# Patient Record
Sex: Male | Born: 1985 | Race: Black or African American | Hispanic: No | Marital: Single | Smoking: Current every day smoker
Health system: Southern US, Community
[De-identification: ages and names within clinical notes are randomized; demographics above are authoritative.]

## PROBLEM LIST (undated history)

## (undated) ENCOUNTER — Ambulatory Visit

## (undated) HISTORY — PX: MOUTH SURGERY: SHX715

---
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
## 2006-02-22 ENCOUNTER — Emergency Department (HOSPITAL_COMMUNITY): Admission: EM | Admit: 2006-02-22 | Discharge: 2006-02-22 | Payer: Self-pay | Admitting: Emergency Medicine

## 2009-04-30 ENCOUNTER — Emergency Department (HOSPITAL_COMMUNITY): Admission: EM | Admit: 2009-04-30 | Discharge: 2009-04-30 | Payer: Self-pay | Admitting: Emergency Medicine

## 2010-03-05 ENCOUNTER — Emergency Department (HOSPITAL_COMMUNITY): Admission: EM | Admit: 2010-03-05 | Discharge: 2009-08-16 | Payer: Self-pay | Admitting: Emergency Medicine

## 2010-12-08 ENCOUNTER — Emergency Department (HOSPITAL_COMMUNITY)
Admission: EM | Admit: 2010-12-08 | Discharge: 2010-12-09 | Disposition: A | Discharge status: Home / Self Care | Payer: Self-pay | Attending: Emergency Medicine | Admitting: Emergency Medicine

## 2010-12-08 ENCOUNTER — Emergency Department (HOSPITAL_COMMUNITY): Payer: Self-pay

## 2010-12-08 DIAGNOSIS — M546 Pain in thoracic spine: Secondary | ICD-10-CM | POA: Insufficient documentation

## 2010-12-08 DIAGNOSIS — R509 Fever, unspecified: Secondary | ICD-10-CM | POA: Insufficient documentation

## 2010-12-08 DIAGNOSIS — J069 Acute upper respiratory infection, unspecified: Secondary | ICD-10-CM | POA: Insufficient documentation

## 2010-12-08 DIAGNOSIS — R05 Cough: Secondary | ICD-10-CM | POA: Insufficient documentation

## 2010-12-08 DIAGNOSIS — J9801 Acute bronchospasm: Secondary | ICD-10-CM | POA: Insufficient documentation

## 2010-12-08 DIAGNOSIS — R062 Wheezing: Secondary | ICD-10-CM | POA: Insufficient documentation

## 2010-12-08 DIAGNOSIS — R059 Cough, unspecified: Secondary | ICD-10-CM | POA: Insufficient documentation

## 2010-12-08 DIAGNOSIS — R599 Enlarged lymph nodes, unspecified: Secondary | ICD-10-CM | POA: Insufficient documentation

## 2010-12-08 DIAGNOSIS — I10 Essential (primary) hypertension: Secondary | ICD-10-CM | POA: Insufficient documentation

## 2011-03-09 ENCOUNTER — Emergency Department (HOSPITAL_COMMUNITY)
Admission: EM | Admit: 2011-03-09 | Discharge: 2011-03-09 | Disposition: A | Discharge status: Home / Self Care | Payer: Self-pay | Attending: Emergency Medicine | Admitting: Emergency Medicine

## 2011-03-09 DIAGNOSIS — J45909 Unspecified asthma, uncomplicated: Secondary | ICD-10-CM | POA: Insufficient documentation

## 2011-03-09 DIAGNOSIS — J3489 Other specified disorders of nose and nasal sinuses: Secondary | ICD-10-CM | POA: Insufficient documentation

## 2011-03-09 DIAGNOSIS — J069 Acute upper respiratory infection, unspecified: Secondary | ICD-10-CM | POA: Insufficient documentation

## 2011-03-09 DIAGNOSIS — R000 Tachycardia, unspecified: Secondary | ICD-10-CM | POA: Insufficient documentation

## 2011-03-09 DIAGNOSIS — R112 Nausea with vomiting, unspecified: Secondary | ICD-10-CM | POA: Insufficient documentation

## 2011-03-09 DIAGNOSIS — R509 Fever, unspecified: Secondary | ICD-10-CM | POA: Insufficient documentation

## 2011-03-09 DIAGNOSIS — R059 Cough, unspecified: Secondary | ICD-10-CM | POA: Insufficient documentation

## 2011-03-09 DIAGNOSIS — R05 Cough: Secondary | ICD-10-CM | POA: Insufficient documentation

## 2011-03-09 MED ORDER — ACETAMINOPHEN 500 MG PO TABS
1000.0000 mg | ORAL_TABLET | ORAL | Status: AC
  Administered 2011-03-09: 1000 mg via ORAL
  Filled 2011-03-09: qty 2

## 2011-03-09 MED ORDER — ONDANSETRON 4 MG PO TBDP: 4.0000 mg | ORAL_TABLET | Freq: Once | ORAL | Status: AC

## 2011-03-09 MED ORDER — ONDANSETRON 4 MG PO TBDP
4.0000 mg | ORAL_TABLET | Freq: Once | ORAL | Status: AC
  Administered 2011-03-09: 4 mg via ORAL
  Filled 2011-03-09: qty 1

## 2011-03-09 MED ORDER — ALBUTEROL SULFATE HFA 108 (90 BASE) MCG/ACT IN AERS
2.0000 | INHALATION_SPRAY | RESPIRATORY_TRACT | Status: DC | PRN
  Administered 2011-03-09: 2 via RESPIRATORY_TRACT
  Filled 2011-03-09: qty 6.7

## 2011-03-09 MED ORDER — ALBUTEROL SULFATE (5 MG/ML) 0.5% IN NEBU
2.5000 mg | INHALATION_SOLUTION | Freq: Once | RESPIRATORY_TRACT | Status: AC
  Administered 2011-03-09: 2.5 mg via RESPIRATORY_TRACT
  Filled 2011-03-09: qty 0.5

## 2011-03-09 NOTE — ED Provider Notes (Signed)
History     CSN: [id number] Arrival date & time: 03/09/2011  2:30 PM   First MD Initiated Contact with Patient 03/09/11 1732      No chief complaint on file.   (Consider location/radiation/quality/duration/timing/severity/associated sxs/prior treatment) HPI The patient presents with 3 days of illness. He notes the gradual onset of cough, congestion, generalized discomfort and fever with nausea and vomiting 3 days ago. Since onset these symptoms have been persistent in spite of OTC medications. There was minimal improvement with albuterol inhaler treatments though the patient ran out of this medication one day ago. Patient notes his maximum temperature is 102. Patient denies any confusion, chest pain when not coughing, diarrhea. Past Medical History  Diagnosis Date  . Asthma     History reviewed. No pertinent past surgical history.  History reviewed. No pertinent family history.  History  Substance Use Topics  . Smoking status: Current Everyday Smoker  . Smokeless tobacco: Not on file  . Alcohol Use: Yes      Review of Systems  Constitutional:       Per HPI, otherwise negative  HENT:       Per HPI, otherwise negative  Eyes: Negative.   Respiratory:       Per HPI, otherwise negative  Cardiovascular:       Per HPI, otherwise negative  Gastrointestinal: Positive for vomiting.  Genitourinary: Negative.   Musculoskeletal:       Per HPI, otherwise negative  Skin: Negative.   Neurological: Negative for syncope.    Allergies  Review of patient's allergies indicates no known allergies.  Home Medications   Current Outpatient Rx  Name Route Sig Dispense Refill  . ALBUTEROL SULFATE HFA 108 (90 BASE) MCG/ACT IN AERS Inhalation Inhale 2 puffs into the lungs every 6 (six) hours as needed. For shortness of breath    . DAYQUIL MULTI-SYMPTOM COLD/FLU PO Oral Take 1 tablet by mouth every 8 (eight) hours as needed. For cough       BP 144/80  Pulse 104  Temp(Src) 101.1 F  (38.4 C) (Oral)  Resp 20  SpO2 94%  Physical Exam  Nursing note and vitals reviewed. Constitutional: He is oriented to person, place, and time. He appears well-developed and well-nourished.  HENT:  Head: Normocephalic and atraumatic.  Eyes: Conjunctivae are normal. Pupils are equal, round, and reactive to light.  Neck: Neck supple.  Cardiovascular: Regular rhythm.  Tachycardia present.   Pulmonary/Chest: No respiratory distress. He has decreased breath sounds. He has no wheezes.  Abdominal: Soft. There is no tenderness.  Musculoskeletal: He exhibits no edema.  Neurological: He is alert and oriented to person, place, and time.  Skin: Skin is warm and dry.  Psychiatric: He has a normal mood and affect.    ED Course  Procedures (including critical care time)  Labs Reviewed - No data to display No results found.   No diagnosis found.  Pulse ox 95% on room air normal  MDM  This young man with asthma now presents with several days of URI like symptoms. On initial exam the patient is in no distress, with no audible wheezing though his lung fields are diminished. Patient notes mild improvement in his symptoms with albuterol treatment. He also noted improvement of his nausea following Zofran. The patient's presentation is consistent with acute upper respiratory infection, possibly influenza given the passage of greater than 2 days since the onset of symptoms he is unlikely to benefit from Tamiflu. The patient will be discharged with antiemetics,  primary care followup.        Gerhard Munch, MD 03/09/11 [phone]

## 2011-03-09 NOTE — ED Notes (Signed)
Pt cough, vomiting, diarrhea, fever x 3 days

## 2011-05-13 IMAGING — CR DG CHEST 2V
2 series · 2 of 2 positions shown · non-contrast
Comparison: None.

CLINICAL DATA: Shortness of breath and productive cough for 1 week;
left-sided back burning.  History of asthma.

CHEST - 2 VIEW

[w chest pa]
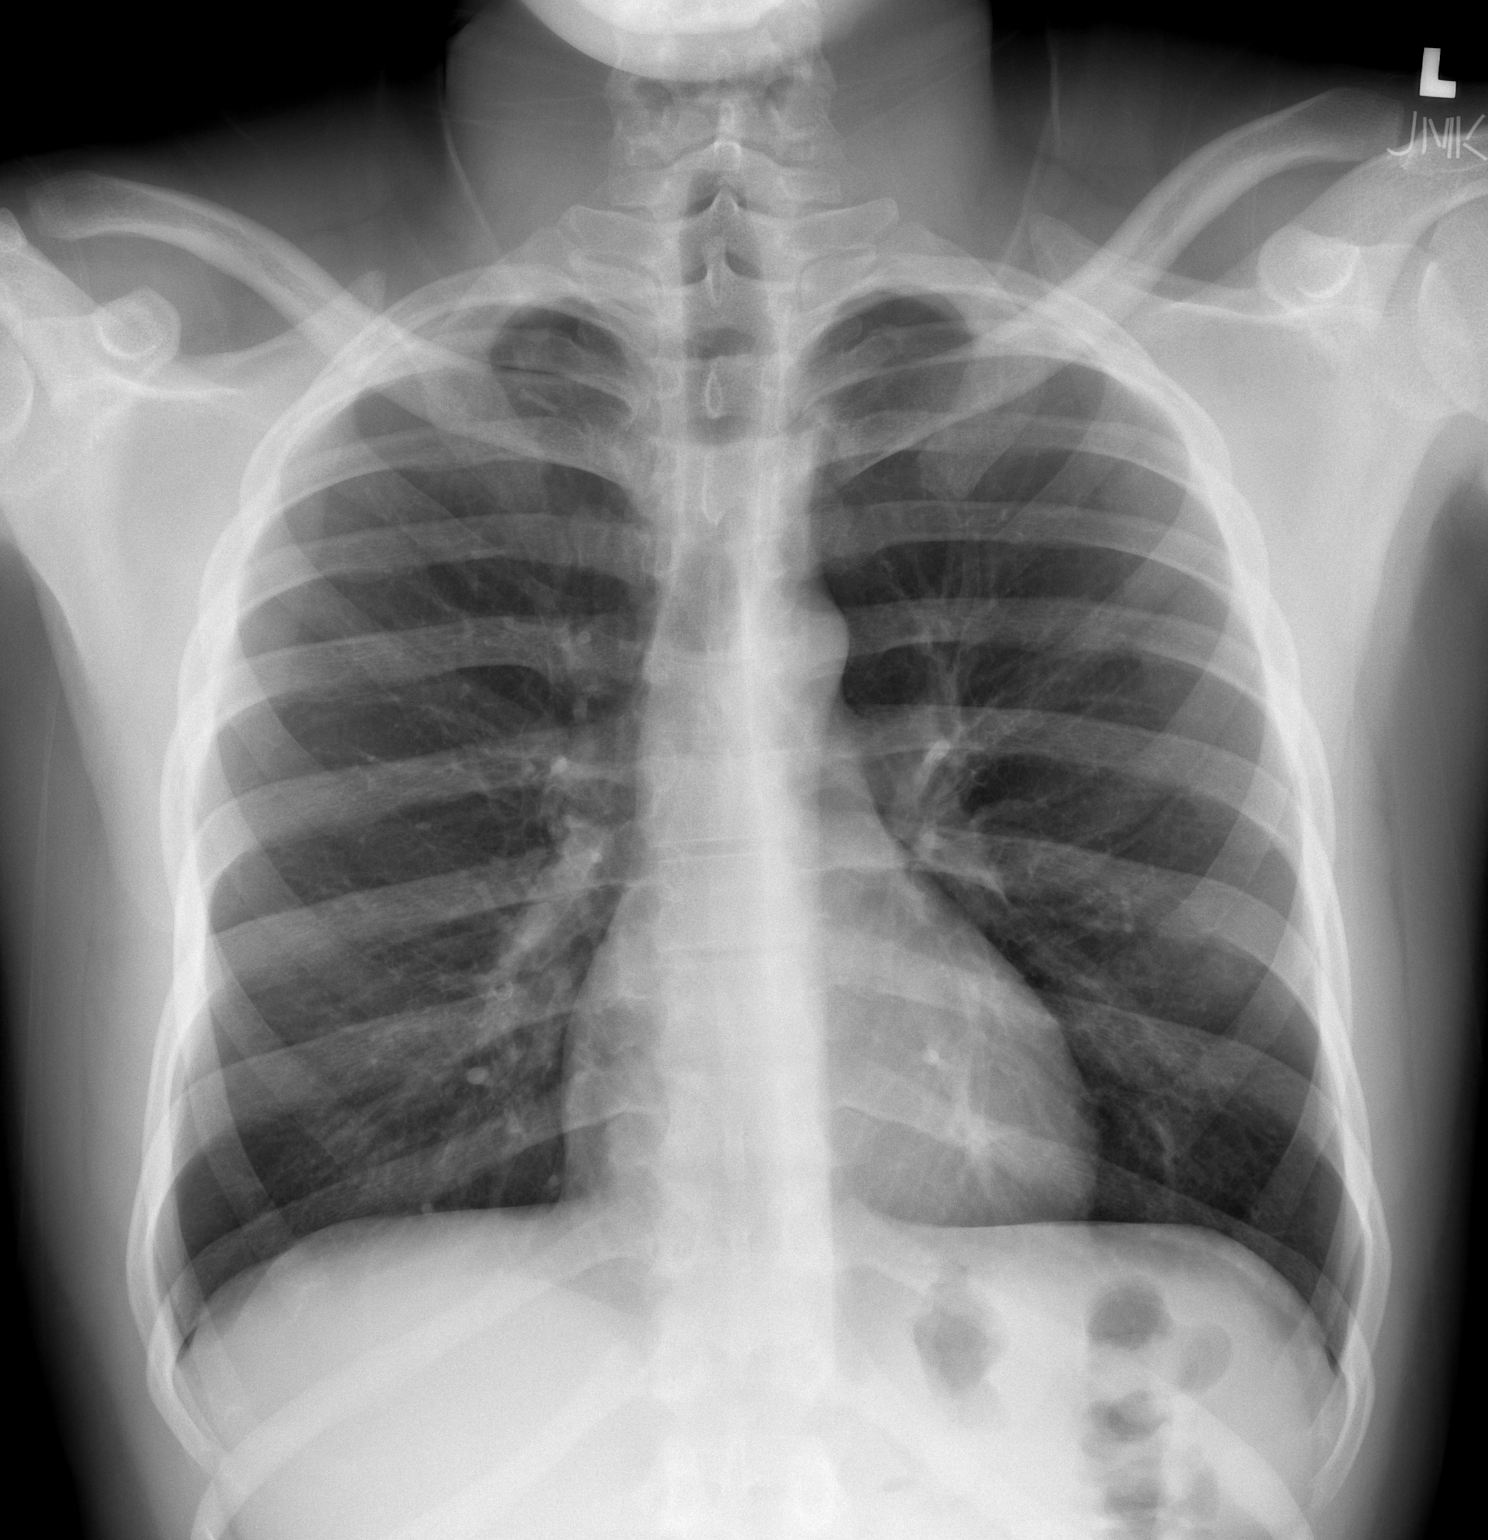

[w chest lat]
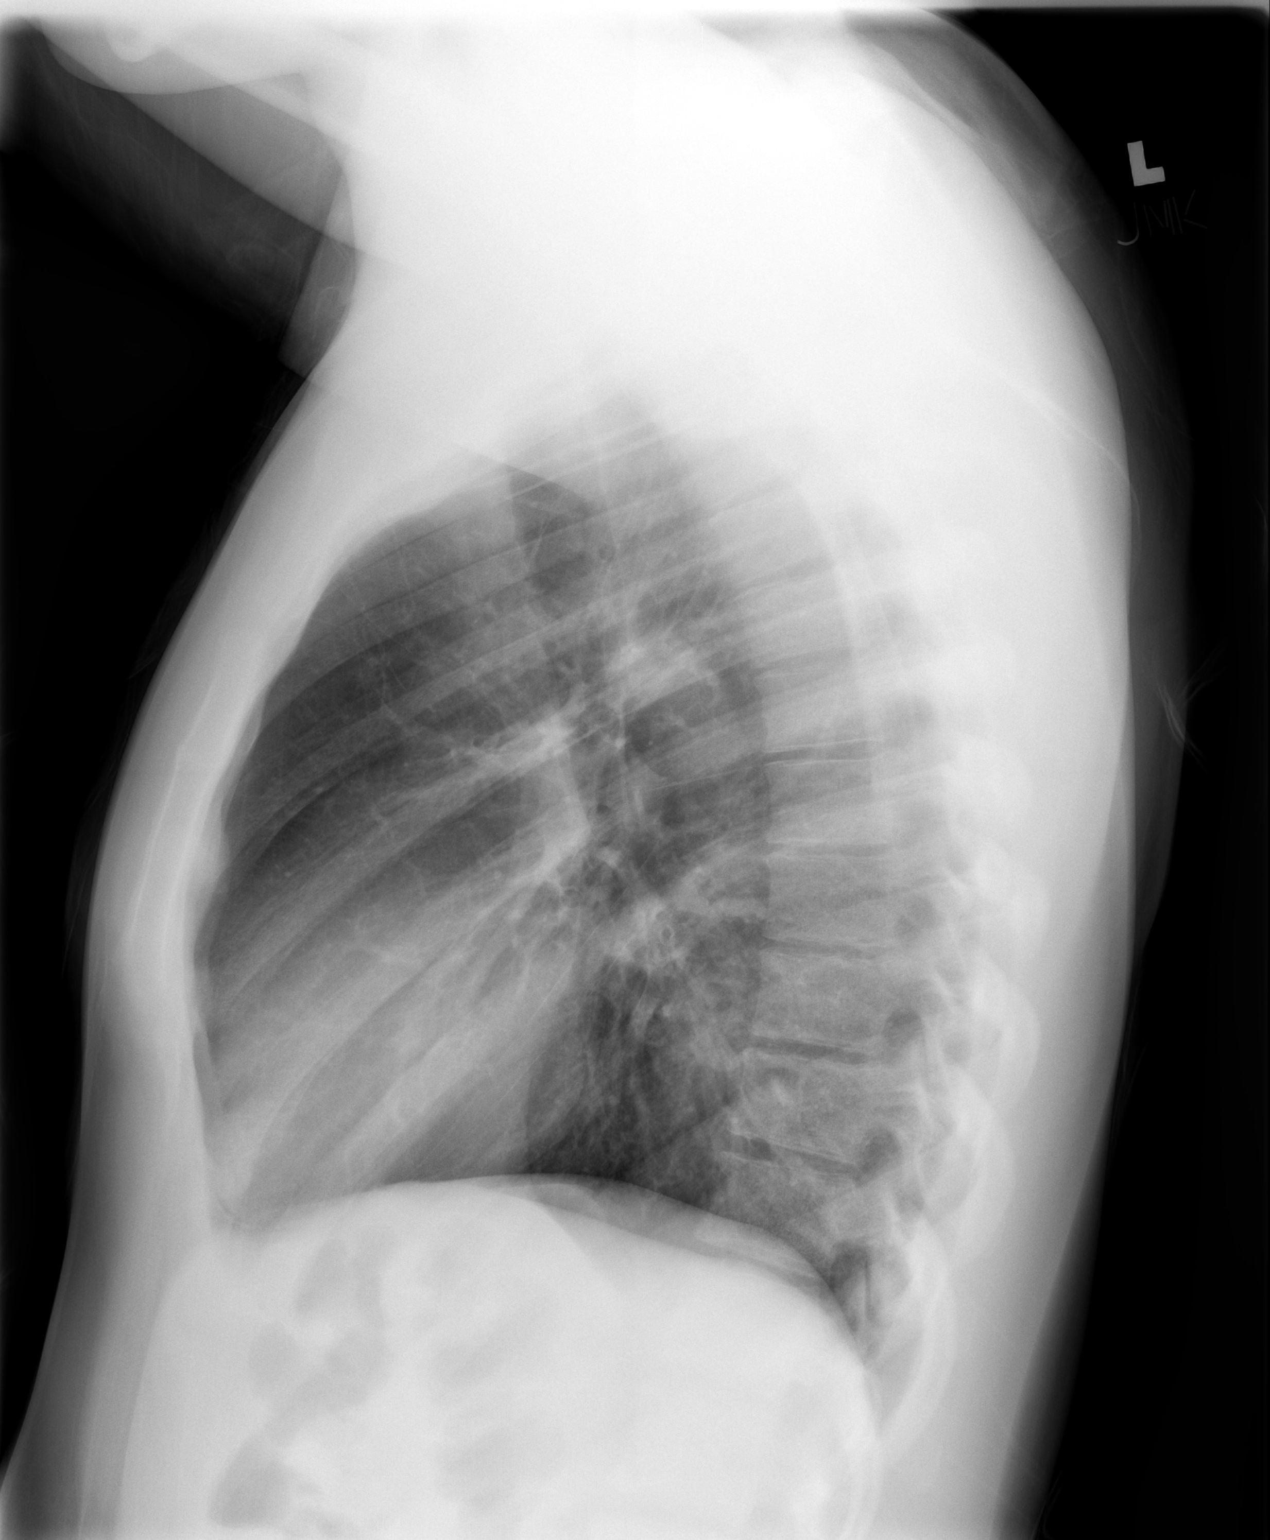

[2 of 2 positions shown; findings below may reference images not displayed]

FINDINGS: The lungs are well-aerated and clear.  There is no
evidence of focal opacification, pleural effusion or pneumothorax.

The heart is normal in size; the mediastinal contour is within
normal limits.  No acute osseous abnormalities are seen.
IMPRESSION: No acute cardiopulmonary process seen.

## 2011-06-14 ENCOUNTER — Encounter (HOSPITAL_COMMUNITY): Payer: Self-pay | Admitting: *Deleted

## 2011-06-14 ENCOUNTER — Emergency Department (INDEPENDENT_AMBULATORY_CARE_PROVIDER_SITE_OTHER)
Admission: EM | Admit: 2011-06-14 | Discharge: 2011-06-14 | Disposition: A | Discharge status: Home / Self Care | Payer: BC Managed Care – PPO | Source: Home / Self Care

## 2011-06-14 DIAGNOSIS — J45901 Unspecified asthma with (acute) exacerbation: Secondary | ICD-10-CM

## 2011-06-14 DIAGNOSIS — J309 Allergic rhinitis, unspecified: Secondary | ICD-10-CM

## 2011-06-14 DIAGNOSIS — J302 Other seasonal allergic rhinitis: Secondary | ICD-10-CM

## 2011-06-14 MED ORDER — PREDNISONE 20 MG PO TABS: 20.0000 mg | ORAL_TABLET | Freq: Two times a day (BID) | ORAL | Status: AC

## 2011-06-14 MED ORDER — ALBUTEROL SULFATE HFA 108 (90 BASE) MCG/ACT IN AERS: 2.0000 | INHALATION_SPRAY | RESPIRATORY_TRACT | Status: DC | PRN

## 2011-06-14 MED ORDER — LORATADINE 10 MG PO TABS: 10.0000 mg | ORAL_TABLET | Freq: Every day | ORAL | Status: AC

## 2011-06-14 NOTE — ED Provider Notes (Signed)
History     CSN: [id number]  Arrival date & time 06/14/11  0840   None     Chief Complaint  Patient presents with  . Asthma    (Consider location/radiation/quality/duration/timing/severity/associated sxs/prior treatment) HPI Comments: Patient states that 4 days ago he was in a home for approximately one hour that had a cat. He has a known cat allergy as well as asthma and seasonal allergies. He states since then his asthma has flared up. She has been using his albuterol inhaler every 2-4 hours since then. He states he even awakens during the night every 4 hours with wheezing. When he uses the albuterol he does get relief of symptoms for a minimum of 2-4 hours. He is not currently taking any allergy medications.  He does admit to nasal congestion and sneezing and also has a mild cough.  No fever or chills. He is a smoker but states that he has not smoked for the last week.   Past Medical History  Diagnosis Date  . Asthma     History reviewed. No pertinent past surgical history.  History reviewed. No pertinent family history.  History  Substance Use Topics  . Smoking status: Former Games developer  . Smokeless tobacco: Not on file  . Alcohol Use: Yes      Review of Systems  Constitutional: Negative for fever, chills and fatigue.  HENT: Positive for congestion and sneezing. Negative for ear pain, sore throat, rhinorrhea and sinus pressure.   Respiratory: Positive for cough, shortness of breath and wheezing.   Cardiovascular: Negative for chest pain.    Allergies  Review of patient's allergies indicates no known allergies.  Home Medications   Current Outpatient Rx  Name Route Sig Dispense Refill  . ALBUTEROL SULFATE HFA 108 (90 BASE) MCG/ACT IN AERS Inhalation Inhale 2 puffs into the lungs every 6 (six) hours as needed. For shortness of breath    . ALBUTEROL SULFATE HFA 108 (90 BASE) MCG/ACT IN AERS Inhalation Inhale 2 puffs into the lungs every 4 (four) hours as needed for  wheezing. 1 Inhaler 0  . LORATADINE 10 MG PO TABS Oral Take 1 tablet (10 mg total) by mouth daily. 30 tablet 0  . PREDNISONE 20 MG PO TABS Oral Take 1 tablet (20 mg total) by mouth 2 (two) times daily. 10 tablet 0    BP 155/83  Pulse 80  Temp(Src) 98.9 F (37.2 C) (Oral)  Resp 16  SpO2 97%  Physical Exam  Nursing note and vitals reviewed. Constitutional: He appears well-developed and well-nourished. No distress.  HENT:  Head: Normocephalic and atraumatic.  Right Ear: Tympanic membrane, external ear and ear canal normal.  Left Ear: Tympanic membrane, external ear and ear canal normal.  Nose: Nose normal.  Mouth/Throat: Uvula is midline, oropharynx is clear and moist and mucous membranes are normal. No oropharyngeal exudate, posterior oropharyngeal edema or posterior oropharyngeal erythema.  Neck: Neck supple.  Cardiovascular: Normal rate, regular rhythm and normal heart sounds.   Pulmonary/Chest: Effort normal. No respiratory distress. He has wheezes (mild wheeze RLL).  Lymphadenopathy:    He has no cervical adenopathy.  Neurological: He is alert.  Skin: Skin is warm and dry.  Psychiatric: He has a normal mood and affect.    ED Course  Procedures (including critical care time)  Labs Reviewed - No data to display No results found.   1. Asthma exacerbation   2. Seasonal allergies       MDM  Asthma exacerbation since cat exposure  4 days ago. Pt also has hx of spring allergies and is a smoker.         Melody Comas, [place] 06/14/11 1005

## 2011-06-14 NOTE — Discharge Instructions (Signed)
Continue albuterol inhaler as needed. Begin Prednisone and Loratadine prescriptions today. Return if symptoms change or worsen.  Allergic Rhinitis Allergic rhinitis is when the mucous membranes in the nose respond to allergens. Allergens are particles in the air that cause your body to have an allergic reaction. This causes you to release allergic antibodies. Through a chain of events, these eventually cause you to release histamine into the blood stream (hence the use of antihistamines). Although meant to be protective to the body, it is this release that causes your discomfort, such as frequent sneezing, congestion and an itchy runny nose.  CAUSES  The pollen allergens may come from grasses, trees, and weeds. This is seasonal allergic rhinitis, or "hay fever." Other allergens cause year-round allergic rhinitis (perennial allergic rhinitis) such as house dust mite allergen, pet dander and mold spores.  SYMPTOMS   Nasal stuffiness (congestion).   Runny, itchy nose with sneezing and tearing of the eyes.   There is often an itching of the mouth, eyes and ears.  It cannot be cured, but it can be controlled with medications. DIAGNOSIS  If you are unable to determine the offending allergen, skin or blood testing may find it. TREATMENT   Avoid the allergen.   Medications and allergy shots (immunotherapy) can help.   Hay fever may often be treated with antihistamines in pill or nasal spray forms. Antihistamines block the effects of histamine. There are over-the-counter medicines that may help with nasal congestion and swelling around the eyes. Check with your caregiver before taking or giving this medicine.  If the treatment above does not work, there are many new medications your caregiver can prescribe. Stronger medications may be used if initial measures are ineffective. Desensitizing injections can be used if medications and avoidance fails. Desensitization is when a patient is given ongoing  shots until the body becomes less sensitive to the allergen. Make sure you follow up with your caregiver if problems continue. SEEK MEDICAL CARE IF:   You develop fever (more than 100.5 F (38.1 C).   You develop a cough that does not stop easily (persistent).   You have shortness of breath.   You start wheezing.   Symptoms interfere with normal daily activities.  Document Released: 12/08/2000 Document Revised: 03/04/2011 Document Reviewed: 06/19/2008 [facility] Patient Information 2012 [place], [place].  Asthma, Adult Asthma is a disease of the lungs and can make it hard to breathe. Asthma cannot be cured, but medicine can help control it. Asthma may be started (triggered) by:  Pollen.   Dust.   Animal skin flakes (dander).   Molds.   Foods.   Respiratory infections (colds, flu).   Smoke.   Exercise.   Stress.   Other things that cause allergic reactions or allergies (allergens).  HOME CARE   Talk to your doctor about how to manage your attacks at home. This may include:   Using a tool called a peak flow meter.   Having medicine ready to stop the attack.   Take all medicine as told by your doctor.   Wash bed sheets and blankets every week in hot water and put them in the dryer.   Drink enough fluids to keep your pee (urine) clear or pale yellow.   Always be ready to get emergency help. Write down the phone number for your doctor. Keep it where you can easily find it.   Talk about exercise routines with your doctor.   If animal dander is causing your asthma, you may need  to find a new home for your pet(s).  GET HELP RIGHT AWAY IF:   You have muscle aches.   You cough more.   You have chest pain.   You have thick spit (sputum) that changes to yellow, green, gray, or bloody.   Medicine does not stop your wheezing.   You have problems breathing.   You have a fever.   Your medicine causes:   A rash.   Itching.   Puffiness (swelling).    Breathing problems.  MAKE SURE YOU:   Understand these instructions.   Will watch your condition.   Will get help right away if you are not doing well or get worse.  Document Released: 09/01/2007 Document Revised: 03/04/2011 Document Reviewed: 01/24/2008 [facility] Patient Information 2012 [place], [place].

## 2011-06-14 NOTE — ED Notes (Signed)
Pt  States he  Has  Asthma    And   Uses  An  Inhaler   -  He  States he  Is   Allergic  To cats  And  Was  Around a  Cat  Recently  He  Reports  Some  Tightness in  His  Chest      And  Has  A  Slight  Wheeze  Present

## 2011-06-15 NOTE — ED Provider Notes (Signed)
Medical screening examination/treatment/procedure(s) were performed by non-physician practitioner and as supervising physician I was immediately available for consultation/collaboration.  LANEY,RONNIE   Maryon Kemnitz B Laney, MD 06/15/11 1602 

## 2011-08-12 ENCOUNTER — Emergency Department (HOSPITAL_COMMUNITY)
Admission: EM | Admit: 2011-08-12 | Discharge: 2011-08-12 | Disposition: A | Discharge status: Home / Self Care | Payer: BC Managed Care – PPO | Attending: Emergency Medicine | Admitting: Emergency Medicine

## 2011-08-12 ENCOUNTER — Emergency Department (HOSPITAL_COMMUNITY): Payer: BC Managed Care – PPO

## 2011-08-12 ENCOUNTER — Encounter (HOSPITAL_COMMUNITY): Payer: Self-pay | Admitting: Physical Medicine and Rehabilitation

## 2011-08-12 DIAGNOSIS — R059 Cough, unspecified: Secondary | ICD-10-CM | POA: Insufficient documentation

## 2011-08-12 DIAGNOSIS — R079 Chest pain, unspecified: Secondary | ICD-10-CM | POA: Insufficient documentation

## 2011-08-12 DIAGNOSIS — J45901 Unspecified asthma with (acute) exacerbation: Secondary | ICD-10-CM

## 2011-08-12 DIAGNOSIS — R0602 Shortness of breath: Secondary | ICD-10-CM | POA: Insufficient documentation

## 2011-08-12 DIAGNOSIS — R1110 Vomiting, unspecified: Secondary | ICD-10-CM | POA: Insufficient documentation

## 2011-08-12 DIAGNOSIS — R05 Cough: Secondary | ICD-10-CM | POA: Insufficient documentation

## 2011-08-12 LAB — D-DIMER, QUANTITATIVE: D-Dimer, Quant: 0.3 ug/mL-FEU (ref 0.00–0.48)

## 2011-08-12 MED ORDER — IPRATROPIUM BROMIDE 0.02 % IN SOLN
0.5000 mg | Freq: Once | RESPIRATORY_TRACT | Status: AC
  Administered 2011-08-12: 0.5 mg via RESPIRATORY_TRACT
  Filled 2011-08-12: qty 2.5

## 2011-08-12 MED ORDER — ALBUTEROL SULFATE (5 MG/ML) 0.5% IN NEBU
2.5000 mg | INHALATION_SOLUTION | Freq: Once | RESPIRATORY_TRACT | Status: AC
  Administered 2011-08-12: 2.5 mg via RESPIRATORY_TRACT
  Filled 2011-08-12: qty 0.5

## 2011-08-12 MED ORDER — IBUPROFEN 800 MG PO TABS
800.0000 mg | ORAL_TABLET | Freq: Once | ORAL | Status: AC
  Administered 2011-08-12: 800 mg via ORAL
  Filled 2011-08-12: qty 1

## 2011-08-12 MED ORDER — PREDNISONE 50 MG PO TABS: ORAL_TABLET | ORAL | Status: AC

## 2011-08-12 MED ORDER — ALBUTEROL SULFATE HFA 108 (90 BASE) MCG/ACT IN AERS: 2.0000 | INHALATION_SPRAY | RESPIRATORY_TRACT | Status: DC | PRN

## 2011-08-12 MED ORDER — PREDNISONE 20 MG PO TABS
60.0000 mg | ORAL_TABLET | Freq: Once | ORAL | Status: AC
  Administered 2011-08-12: 60 mg via ORAL
  Filled 2011-08-12: qty 3

## 2011-08-12 NOTE — ED Notes (Signed)
Pt presents with 2-3 day h/o pain.  Pt reports "my lungs hurt".  Pt reports coughing, unable to clear phlegm.  Pt reports nasal congestion, diagnosed with sinus infection x 2-3 days ago.

## 2011-08-12 NOTE — ED Provider Notes (Signed)
History   This chart was scribed for Glynn Octave, MD by Charolett Bumpers . The patient was seen in room STRE8/STRE8.    CSN: [id number]  Arrival date & time 08/12/11  1344   First MD Initiated Contact with Patient 08/12/11 1448      Chief Complaint  Patient presents with  . Shortness of Breath    (Consider location/radiation/quality/duration/timing/severity/associated sxs/prior treatment) HPI Alan Murphy is a 26 y.o. male who presents to the Emergency Department complaining of constant, moderate SOB with associated coughing, mid back pain, chest pain and vomiting. Patient states that his chest pain is a 7/10 and states "my lungs feel like they are on fire." Patient reports a h/o asthma. Patient states that he has been taking Prednisone for the past 2 weeks, Albuterol and Claritin with some relief. Patient states that he has been using albuterol every 6 hours. Patient states that he has been hospitalized for asthma previously. Patient states that he does not have a PCP.   Past Medical History  Diagnosis Date  . Asthma     No past surgical history on file.  History reviewed. No pertinent family history.  History  Substance Use Topics  . Smoking status: Former Games developer  . Smokeless tobacco: Not on file  . Alcohol Use: Yes      Review of Systems  Respiratory: Positive for cough and shortness of breath.   Cardiovascular: Positive for chest pain.  Gastrointestinal: Positive for vomiting.  All other systems reviewed and are negative.    Allergies  Review of patient's allergies indicates no known allergies.  Home Medications   Current Outpatient Rx  Name Route Sig Dispense Refill  . ALBUTEROL SULFATE HFA 108 (90 BASE) MCG/ACT IN AERS Inhalation Inhale 2 puffs into the lungs every 6 (six) hours as needed. For shortness of breath    . LORATADINE 10 MG PO TABS Oral Take 1 tablet (10 mg total) by mouth daily. 30 tablet 0  . PREDNISONE 20 MG PO TABS Oral Take 20  mg by mouth once.      BP 156/82  Pulse 115  Temp(Src) 99.3 F (37.4 C) (Oral)  Resp 22  SpO2 94%  Physical Exam  Nursing note and vitals reviewed. Constitutional: He is oriented to person, place, and time. He appears well-developed and well-nourished. No distress.  HENT:  Head: Normocephalic and atraumatic.  Eyes: EOM are normal.  Neck: Neck supple. No tracheal deviation present.  Cardiovascular: Normal rate, regular rhythm and normal heart sounds.   Pulmonary/Chest: Effort normal. No respiratory distress.       Tight wheezing at the right base. Moderate air exchange. Speaking in full sentences.   Musculoskeletal: Normal range of motion.  Neurological: He is alert and oriented to person, place, and time.  Skin: Skin is warm and dry.  Psychiatric: He has a normal mood and affect. His behavior is normal.    ED Course  Procedures (including critical care time)  DIAGNOSTIC STUDIES: Oxygen Saturation is 94% on room air, adequate by my interpretation.    COORDINATION OF CARE:  1455: Discussed planned course of treatment with the patient, who is agreeable at this time.  1627: Recheck: reevaluation of the patient. Patient states he is feeling improved. Discussed imaging and lab results.     Labs Reviewed  D-DIMER, QUANTITATIVE   Dg Chest 2 View  08/12/2011  *RADIOLOGY REPORT*  Clinical Data: Cough, shortness of breath.  CHEST - 2 VIEW  Comparison: 12/08/2010  Findings: Heart  and mediastinal contours are within normal limits. No focal opacities or effusions.  No acute bony abnormality.  IMPRESSION: No active cardiopulmonary disease.  Original Report Authenticated By: Cyndie Chime, M.D.     No diagnosis found.    MDM  Cough, congestion, shortness of breath and chest pain. States "lungs are burning". History of asthma active smoker.  CXR negative. D-dimer negative.  Ambulatory without desaturation. Will treat as asthma exacerbation.  I personally performed the  services described in this documentation, which was scribed in my presence.  The recorded information has been reviewed and considered.      Glynn Octave, MD 08/12/11 [phone]

## 2011-08-12 NOTE — Discharge Instructions (Signed)

## 2011-08-12 NOTE — ED Notes (Signed)
Pt ambulated thru triage. Pulse ox dropped from 94% to 92 %. No sob noted, however pt c/o tightness on left side of chest.

## 2011-08-12 NOTE — ED Notes (Signed)
Pt claimed that he feels much better.

## 2011-08-12 NOTE — ED Notes (Signed)
Pt presents to department for evaluation of shortness of breath. Pt states he has chest/sinus congestion and states he had trouble breathing this morning. Respirations unlabored upon arrival. Speaking complete sentences. States "my lungs feel like they are on fire." 7/10 pain at the time. No signs of acute distress noted.

## 2013-04-12 ENCOUNTER — Emergency Department (HOSPITAL_COMMUNITY)
Admission: EM | Admit: 2013-04-12 | Discharge: 2013-04-12 | Disposition: A | Discharge status: Home / Self Care | Payer: Medicaid Other | Attending: Emergency Medicine | Admitting: Emergency Medicine

## 2013-04-12 ENCOUNTER — Encounter (HOSPITAL_COMMUNITY): Payer: Self-pay | Admitting: Emergency Medicine

## 2013-04-12 DIAGNOSIS — J4520 Mild intermittent asthma, uncomplicated: Secondary | ICD-10-CM

## 2013-04-12 DIAGNOSIS — Z87891 Personal history of nicotine dependence: Secondary | ICD-10-CM | POA: Insufficient documentation

## 2013-04-12 DIAGNOSIS — F1221 Cannabis dependence, in remission: Secondary | ICD-10-CM | POA: Insufficient documentation

## 2013-04-12 DIAGNOSIS — J069 Acute upper respiratory infection, unspecified: Secondary | ICD-10-CM | POA: Insufficient documentation

## 2013-04-12 DIAGNOSIS — J45909 Unspecified asthma, uncomplicated: Secondary | ICD-10-CM | POA: Insufficient documentation

## 2013-04-12 MED ORDER — IPRATROPIUM-ALBUTEROL 0.5-2.5 (3) MG/3ML IN SOLN
3.0000 mL | Freq: Once | RESPIRATORY_TRACT | Status: AC
  Administered 2013-04-12: 3 mL via RESPIRATORY_TRACT
  Filled 2013-04-12: qty 3

## 2013-04-12 MED ORDER — PREDNISONE 10 MG PO TABS: 20.0000 mg | ORAL_TABLET | Freq: Every day | ORAL | Status: DC

## 2013-04-12 MED ORDER — ALBUTEROL SULFATE (2.5 MG/3ML) 0.083% IN NEBU: 5.0000 mg | INHALATION_SOLUTION | Freq: Once | RESPIRATORY_TRACT | Status: DC

## 2013-04-12 MED ORDER — ALBUTEROL SULFATE HFA 108 (90 BASE) MCG/ACT IN AERS
2.0000 | INHALATION_SPRAY | RESPIRATORY_TRACT | Status: DC | PRN
  Administered 2013-04-12: 2 via RESPIRATORY_TRACT
  Filled 2013-04-12: qty 6.7

## 2013-04-12 MED ORDER — BENZONATATE 100 MG PO CAPS: 100.0000 mg | ORAL_CAPSULE | Freq: Three times a day (TID) | ORAL | Status: DC

## 2013-04-12 MED ORDER — GUAIFENESIN 100 MG/5ML PO SYRP: 100.0000 mg | ORAL_SOLUTION | ORAL | Status: DC | PRN

## 2013-04-12 NOTE — ED Provider Notes (Signed)
Medical screening examination/treatment/procedure(s) were performed by non-physician practitioner and as supervising physician I was immediately available for consultation/collaboration.  EKG Interpretation   None         Wyvonne Carda, MD 04/12/13 1749 

## 2013-04-12 NOTE — ED Notes (Signed)
Pt reports yesterday he started to have a cough that progressively got worse last night. Then last night he woke up in a sweat. This morning spit up some very thick mucus. Reports now he is feeling sob. Used inhaler this morning, gave him some relief for a few minutes then back to feeling sob again. Nad, skin warm and dry, resp e/u. Speaking in full sentences.

## 2013-04-12 NOTE — Discharge Instructions (Signed)
Asthma, Acute Bronchospasm °Acute bronchospasm caused by asthma is also referred to as an asthma attack. Bronchospasm means your air passages become narrowed. The narrowing is caused by inflammation and tightening of the muscles in the air tubes (bronchi) in your lungs. This can make it hard to breath or cause you to wheeze and cough. °CAUSES °Possible triggers are: °· Animal dander from the skin, hair, or feathers of animals. °· Dust mites contained in house dust. °· Cockroaches. °· Pollen from trees or grass. °· Mold. °· Cigarette or tobacco smoke. °· Air pollutants such as dust, household cleaners, hair sprays, aerosol sprays, paint fumes, strong chemicals, or strong odors. °· Cold air or weather changes. Cold air may trigger inflammation. Winds increase molds and pollens in the air. °· Strong emotions such as crying or laughing hard. °· Stress. °· Certain medicines such as aspirin or beta-blockers. °· Sulfites in foods and drinks, such as dried fruits and wine. °· Infections or inflammatory conditions, such as a flu, cold, or inflammation of the nasal membranes (rhinitis). °· Gastroesophageal reflux disease (GERD). GERD is a condition where stomach acid backs up into your throat (esophagus). °· Exercise or strenuous activity. °SIGNS AND SYMPTOMS  °· Wheezing. °· Excessive coughing, particularly at night. °· Chest tightness. °· Shortness of breath. °DIAGNOSIS  °Your health care provider will ask you about your medical history and perform a physical exam. A chest X-ray or blood testing may be performed to look for other causes of your symptoms or other conditions that may have triggered your asthma attack.  °TREATMENT  °Treatment is aimed at reducing inflammation and opening up the airways in your lungs.  Most asthma attacks are treated with inhaled medicines. These include quick relief or rescue medicines (such as bronchodilators) and controller medicines (such as inhaled corticosteroids). These medicines are  sometimes given through an inhaler or a nebulizer. Systemic steroid medicine taken by mouth or given through an IV tube also can be used to reduce the inflammation when an attack is moderate or severe. Antibiotic medicines are only used if a bacterial infection is present.  °HOME CARE INSTRUCTIONS  °· Rest. °· Drink plenty of liquids. This helps the mucus to remain thin and be easily coughed up. Only use caffeine in moderation and do not use alcohol until you have recovered from your illness. °· Do not smoke. Avoid being exposed to secondhand smoke. °· You play a critical role in keeping yourself in good health. Avoid exposure to things that cause you to wheeze or to have breathing problems. °· Keep your medicines up to date and available. Carefully follow your health care provider's treatment plan. °· Take your medicine exactly as prescribed. °· When pollen or pollution is bad, keep windows closed and use an air conditioner or go to places with air conditioning. °· Asthma requires careful medical care. See your health care provider for a follow-up as advised. If you are more than [redacted] weeks pregnant and you were prescribed any new medicines, let your obstetrician know about the visit and how you are doing. Follow-up with your health care provider as directed. °· After you have recovered from your asthma attack, make an appointment with your outpatient doctor to talk about ways to reduce the likelihood of future attacks. If you do not have a doctor who manages your asthma, make an appointment with a primary care doctor to discuss your asthma. °SEEK IMMEDIATE MEDICAL CARE IF:  °· You are getting worse. °· You have trouble breathing. If severe, call   your local emergency services (911 in the [place]  You develop chest pain or discomfort.  You are vomiting.  You are not able to keep fluids down.  You are coughing up yellow, green, brown, or bloody sputum.  You have a fever and your symptoms suddenly get  worse.  You have trouble swallowing. MAKE SURE YOU:   Understand these instructions.  Will watch your condition.  Will get help right away if you are not doing well or get worse. Document Released: 06/30/2006 Document Revised: 11/15/2012 Document Reviewed: 09/20/2012 [facility] Patient Information 2014 [place], [place].  Upper Respiratory Infection, Adult An upper respiratory infection (URI) is also sometimes known as the common cold. The upper respiratory tract includes the nose, sinuses, throat, trachea, and bronchi. Bronchi are the airways leading to the lungs. Most people improve within 1 week, but symptoms can last up to 2 weeks. A residual cough may last even longer.  CAUSES Many different viruses can infect the tissues lining the upper respiratory tract. The tissues become irritated and inflamed and often become very moist. Mucus production is also common. A cold is contagious. You can easily spread the virus to others by oral contact. This includes kissing, sharing a glass, coughing, or sneezing. Touching your mouth or nose and then touching a surface, which is then touched by another person, can also spread the virus. SYMPTOMS  Symptoms typically develop 1 to 3 days after you come in contact with a cold virus. Symptoms vary from person to person. They may include:  Runny nose.  Sneezing.  Nasal congestion.  Sinus irritation.  Sore throat.  Loss of voice (laryngitis).  Cough.  Fatigue.  Muscle aches.  Loss of appetite.  Headache.  Low-grade fever. DIAGNOSIS  You might diagnose your own cold based on familiar symptoms, since most people get a cold 2 to 3 times a year. Your caregiver can confirm this based on your exam. Most importantly, your caregiver can check that your symptoms are not due to another disease such as strep throat, sinusitis, pneumonia, asthma, or epiglottitis. Blood tests, throat tests, and X-rays are not necessary to diagnose a common cold, but they  may sometimes be helpful in excluding other more serious diseases. Your caregiver will decide if any further tests are required. RISKS AND COMPLICATIONS  You may be at risk for a more severe case of the common cold if you smoke cigarettes, have chronic heart disease (such as heart failure) or lung disease (such as asthma), or if you have a weakened immune system. The very young and very old are also at risk for more serious infections. Bacterial sinusitis, middle ear infections, and bacterial pneumonia can complicate the common cold. The common cold can worsen asthma and chronic obstructive pulmonary disease (COPD). Sometimes, these complications can require emergency medical care and may be life-threatening. PREVENTION  The best way to protect against getting a cold is to practice good hygiene. Avoid oral or hand contact with people with cold symptoms. Wash your hands often if contact occurs. There is no clear evidence that vitamin C, vitamin E, echinacea, or exercise reduces the chance of developing a cold. However, it is always recommended to get plenty of rest and practice good nutrition. TREATMENT  Treatment is directed at relieving symptoms. There is no cure. Antibiotics are not effective, because the infection is caused by a virus, not by bacteria. Treatment may include:  Increased fluid intake. Sports drinks offer valuable electrolytes, sugars, and fluids.  Breathing heated mist or steam (  vaporizer or shower).  Eating chicken soup or other clear broths, and maintaining good nutrition.  Getting plenty of rest.  Using gargles or lozenges for comfort.  Controlling fevers with ibuprofen or acetaminophen as directed by your caregiver.  Increasing usage of your inhaler if you have asthma. Zinc gel and zinc lozenges, taken in the first 24 hours of the common cold, can shorten the duration and lessen the severity of symptoms. Pain medicines may help with fever, muscle aches, and throat pain. A  variety of non-prescription medicines are available to treat congestion and runny nose. Your caregiver can make recommendations and may suggest nasal or lung inhalers for other symptoms.  HOME CARE INSTRUCTIONS   Only take over-the-counter or prescription medicines for pain, discomfort, or fever as directed by your caregiver.  Use a warm mist humidifier or inhale steam from a shower to increase air moisture. This may keep secretions moist and make it easier to breathe.  Drink enough water and fluids to keep your urine clear or pale yellow.  Rest as needed.  Return to work when your temperature has returned to normal or as your caregiver advises. You may need to stay home longer to avoid infecting others. You can also use a face mask and careful hand washing to prevent spread of the virus. SEEK MEDICAL CARE IF:   After the first few days, you feel you are getting worse rather than better.  You need your caregiver's advice about medicines to control symptoms.  You develop chills, worsening shortness of breath, or brown or red sputum. These may be signs of pneumonia.  You develop yellow or brown nasal discharge or pain in the face, especially when you bend forward. These may be signs of sinusitis.  You develop a fever, swollen neck glands, pain with swallowing, or white areas in the back of your throat. These may be signs of strep throat. SEEK IMMEDIATE MEDICAL CARE IF:   You have a fever.  You develop severe or persistent headache, ear pain, sinus pain, or chest pain.  You develop wheezing, a prolonged cough, cough up blood, or have a change in your usual mucus (if you have chronic lung disease).  You develop sore muscles or a stiff neck. Document Released: 09/08/2000 Document Revised: 06/07/2011 Document Reviewed: 07/17/2010 [facility] Patient Information 2014 [employer], [place].

## 2013-04-12 NOTE — ED Provider Notes (Signed)
CSN: [id number]     Arrival date & time 04/12/13  [id number] History   First MD Initiated Contact with Patient 04/12/13 [phone]     Chief Complaint  Patient presents with  . Asthma   (Consider location/radiation/quality/duration/timing/severity/associated sxs/prior Treatment) HPI  28 year old male with history of asthma presents for evaluations of wheezing and cough. Patient states since last night he has had experiencing a productive cough, spitting up some thick mucus this morning, having increased wheezing and shortness of breath. He also break out into a sweat. He uses his inhaler last night that provide some relief of the symptoms shortly return prompting him to come to ER for further evaluation. Does report mild runny nose, and sneezing. Complaining of body aches, sore throat, and decreased appetite. He also took over-the-counter cold medication and uses inhaler for relief. Denies history of complicated asthma requiring intubation and ICU stay. Denies any recent travel, prolonged bed rest, recent surgery, calf pain or leg swelling.   Past Medical History  Diagnosis Date  . Asthma    Past Surgical History  Procedure Laterality Date  . Mouth surgery     No family history on file. History  Substance Use Topics  . Smoking status: Former Games developermoker  . Smokeless tobacco: Not on file  . Alcohol Use: Yes    Review of Systems  All other systems reviewed and are negative.    Allergies  Review of patient's allergies indicates no known allergies.  Home Medications   Current Outpatient Rx  Name  Route  Sig  Dispense  Refill  . albuterol (PROVENTIL HFA;VENTOLIN HFA) 108 (90 BASE) MCG/ACT inhaler   Inhalation   Inhale 2 puffs into the lungs every 6 (six) hours as needed. For shortness of breath         . Chlorpheniramine-Acetaminophen (CORICIDIN HBP COLD/FLU PO)   Oral   Take 2 tablets by mouth daily as needed (for cough).         . EXPIRED: albuterol (PROVENTIL HFA;VENTOLIN HFA) 108  (90 BASE) MCG/ACT inhaler   Inhalation   Inhale 2 puffs into the lungs every 4 (four) hours as needed for wheezing.   1 each   0    BP 131/80  Pulse 87  Temp(Src) 98.7 F (37.1 C) (Oral)  Resp 22  Ht 5\' 8"  (1.727 m)  Wt 212 lb (96.163 kg)  BMI 32.24 kg/m2  SpO2 97% Physical Exam  Constitutional: He appears well-developed and well-nourished. No distress.  HENT:  Head: Atraumatic.  Right Ear: External ear normal.  Left Ear: External ear normal.  Nose: Nose normal.  Mouth/Throat: Oropharynx is clear and moist. No oropharyngeal exudate.  Eyes: Conjunctivae are normal.  Neck: Normal range of motion. Neck supple. No JVD present.  Cardiovascular: Normal rate, regular rhythm and intact distal pulses.  Exam reveals no gallop and no friction rub.   No murmur heard. Pulmonary/Chest: Effort normal. No respiratory distress. He has wheezes (Faint inspiratory and expiratory wheezes without rales or rhonchi heard). He has no rales. He exhibits no tenderness.  Abdominal: Soft. There is no tenderness.  Musculoskeletal: He exhibits no edema and no tenderness.  Lymphadenopathy:    He has no cervical adenopathy.  Neurological: He is alert.  Skin: No rash noted.  Psychiatric: He has a normal mood and affect.    ED Course  Procedures (including critical care time)  11:21 AM Patient presents with flulike symptoms. Does have some mild wheezes on initial exam but in no acute respiratory distress. After  receiving breathing treatment here patient felt much better. No hypoxia and no fever to suggest pneumonia at this time. Plan to treat his symptoms symptomatically. Patient recommended to return if symptoms worsen. We'll provide inhaler as well. He is PERC neg, doubt PE.    Labs Review Labs Reviewed - No data to display Imaging Review No results found.  EKG Interpretation   None       MDM   1. Asthma, mild intermittent   2. URI (upper respiratory infection)    BP 132/72  Pulse 81   Temp(Src) 98.7 F (37.1 C) (Oral)  Resp 22  Ht 5\' 8"  (1.727 m)  Wt 212 lb (96.163 kg)  BMI 32.24 kg/m2  SpO2 95%     Fayrene Helper, PA-C 04/12/13 1127

## 2013-04-12 NOTE — ED Notes (Signed)
Pt finished breathing treatment  

## 2013-04-25 ENCOUNTER — Emergency Department (HOSPITAL_COMMUNITY)
Admission: EM | Admit: 2013-04-25 | Discharge: 2013-04-26 | Disposition: A | Discharge status: Home / Self Care | Payer: Medicaid Other | Attending: Emergency Medicine | Admitting: Emergency Medicine

## 2013-04-25 ENCOUNTER — Emergency Department (HOSPITAL_COMMUNITY): Payer: Medicaid Other

## 2013-04-25 ENCOUNTER — Encounter (HOSPITAL_COMMUNITY): Payer: Self-pay | Admitting: Emergency Medicine

## 2013-04-25 DIAGNOSIS — J40 Bronchitis, not specified as acute or chronic: Secondary | ICD-10-CM

## 2013-04-25 DIAGNOSIS — Z79899 Other long term (current) drug therapy: Secondary | ICD-10-CM | POA: Insufficient documentation

## 2013-04-25 DIAGNOSIS — Z87891 Personal history of nicotine dependence: Secondary | ICD-10-CM | POA: Insufficient documentation

## 2013-04-25 DIAGNOSIS — J45901 Unspecified asthma with (acute) exacerbation: Secondary | ICD-10-CM | POA: Insufficient documentation

## 2013-04-25 DIAGNOSIS — R000 Tachycardia, unspecified: Secondary | ICD-10-CM | POA: Insufficient documentation

## 2013-04-25 MED ORDER — ALBUTEROL SULFATE (2.5 MG/3ML) 0.083% IN NEBU
5.0000 mg | INHALATION_SOLUTION | Freq: Once | RESPIRATORY_TRACT | Status: AC
  Administered 2013-04-25: 5 mg via RESPIRATORY_TRACT
  Filled 2013-04-25: qty 6

## 2013-04-25 MED ORDER — IPRATROPIUM BROMIDE 0.02 % IN SOLN
0.5000 mg | Freq: Once | RESPIRATORY_TRACT | Status: AC
  Administered 2013-04-25: 0.5 mg via RESPIRATORY_TRACT
  Filled 2013-04-25: qty 2.5

## 2013-04-25 MED ORDER — PREDNISONE 20 MG PO TABS
60.0000 mg | ORAL_TABLET | Freq: Once | ORAL | Status: AC
  Administered 2013-04-25: 60 mg via ORAL
  Filled 2013-04-25: qty 3

## 2013-04-25 NOTE — ED Provider Notes (Signed)
CSN: [id number]     Arrival date & time 04/25/13  2222 History  This chart was scribed for non-physician practitioner Ivonne Andrew, PA-C working with Sunnie Nielsen, MD by Donne Anon, ED Scribe. This patient was seen in room TR08C/TR08C and the patient's care was started at 11:02 PM.    None    Chief Complaint  Patient presents with  . Shortness of Breath    The history is provided by the patient. No language interpreter was used.   HPI Comments: Alan Murphy is a 28 y.o. male with hx of asthma, who presents to the Emergency Department complaining of 1 week of gradual onset, intermittent productive cough. He was seen in the ED 2 weeks ago for the same complaint, and reports he began feeling better, but his cough and congestion came back over the past week. He has tried an Albuterol inhaler with some relief and reports running out today.  He reports 3-4 episodes of coughing up blood while he had a nosebleed. He denies fever, chills, diaphoresis or any other symptoms.  He reports he quit smoking 1 month ago.     Past Medical History  Diagnosis Date  . Asthma    Past Surgical History  Procedure Laterality Date  . Mouth surgery     History reviewed. No pertinent family history. History  Substance Use Topics  . Smoking status: Former Games developer  . Smokeless tobacco: Not on file  . Alcohol Use: Yes    Review of Systems  Constitutional: Negative for fever, chills and diaphoresis.  HENT: Positive for congestion.   Respiratory: Positive for cough and wheezing.   All other systems reviewed and are negative.    Allergies  Review of patient's allergies indicates no known allergies.  Home Medications   Current Outpatient Rx  Name  Route  Sig  Dispense  Refill  . albuterol (PROVENTIL HFA;VENTOLIN HFA) 108 (90 BASE) MCG/ACT inhaler   Inhalation   Inhale 2 puffs into the lungs every 6 (six) hours as needed. For shortness of breath         . EXPIRED: albuterol (PROVENTIL  HFA;VENTOLIN HFA) 108 (90 BASE) MCG/ACT inhaler   Inhalation   Inhale 2 puffs into the lungs every 4 (four) hours as needed for wheezing.   1 each   0    BP 157/109  Pulse 112  Temp(Src) 98.3 F (36.8 C) (Oral)  Resp 18  SpO2 98%  Physical Exam  Nursing note and vitals reviewed. Constitutional: He appears well-developed and well-nourished. No distress.  HENT:  Head: Normocephalic and atraumatic.  Mouth/Throat: Oropharynx is clear and moist.  Eyes: Conjunctivae are normal.  Neck: Normal range of motion. Neck supple. No tracheal deviation present.  Cardiovascular: Regular rhythm.  Tachycardia present.  Exam reveals no gallop and no friction rub.   No murmur heard. Pulmonary/Chest: Effort normal. No stridor. No respiratory distress. He has wheezes. He has no rales. He exhibits no tenderness.  Abdominal: Soft.  Musculoskeletal: Normal range of motion. He exhibits no edema and no tenderness.  Clinical signs for DVT  Neurological: He is alert.  Skin: Skin is warm and dry.  Psychiatric: He has a normal mood and affect. His behavior is normal.    ED Course  Procedures  DIAGNOSTIC STUDIES: Oxygen Saturation is 98% on RA, normal by my interpretation.    COORDINATION OF CARE: 11:08 PM Discussed treatment plan which includes a breathing treatment with pt at bedside and pt agreed to plan. Discussed imaging. Will  give resource guide.   Patient feels improved after breathing treatment. Chest x-ray unremarkable. We'll treat with continued bronchitis and asthma-type symptoms given no fever. Patient sent home with albuterol inhaler and prescriptions for prednisone and Mucinex. Strict return precautions given   Imaging Review Dg Chest 2 View  04/25/2013   CLINICAL DATA:  Shortness of breath, cough, asthma  EXAM: CHEST  2 VIEW  COMPARISON:  08/12/2011  FINDINGS: The heart size and mediastinal contours are within normal limits. Both lungs are clear. The visualized skeletal structures are  unremarkable.  IMPRESSION: No active cardiopulmonary disease.   Electronically Signed   By: Ruel Favorsrevor  Shick M.D.   On: 04/25/2013 23:39     MDM   1. Bronchitis      I personally performed the services described in this documentation, which was scribed in my presence. The recorded information has been reviewed and is accurate.     Angus SellerPeter S Anijah Spohr, PA-C 04/27/13 [id number]

## 2013-04-25 NOTE — ED Notes (Signed)
Pt states that he took his last 2 puffs from his inhaler this afternoon and has been having a burning feeling when he takes a breath. Pt states productive cough clear. Pt states that after he coughs he feels better breathing.

## 2013-04-25 NOTE — ED Notes (Signed)
Patient presents with difficulty breathing.  States he has been SOB for 2 days but since his girlfriend just had a baby he did not want to leave them.  States he has a history of asthma but other doctors told him he did not need anything everyday.

## 2013-04-26 MED ORDER — PREDNISONE 10 MG PO TABS: 10.0000 mg | ORAL_TABLET | Freq: Every day | ORAL | Status: DC

## 2013-04-26 MED ORDER — ALBUTEROL SULFATE HFA 108 (90 BASE) MCG/ACT IN AERS
2.0000 | INHALATION_SPRAY | RESPIRATORY_TRACT | Status: DC | PRN
  Administered 2013-04-26: 2 via RESPIRATORY_TRACT
  Filled 2013-04-26: qty 6.7

## 2013-04-26 MED ORDER — GUAIFENESIN ER 600 MG PO TB12: 600.0000 mg | ORAL_TABLET | Freq: Two times a day (BID) | ORAL | Status: DC

## 2013-04-26 NOTE — Discharge Instructions (Signed)
Please follow up with a primary care provider for continued evaluation and treatment.    Bronchitis Bronchitis is inflammation of the airways that extend from the windpipe into the lungs (bronchi). The inflammation often causes mucus to develop, which leads to a cough. If the inflammation becomes severe, it may cause shortness of breath. CAUSES  Bronchitis may be caused by:   Viral infections.   Bacteria.   Cigarette smoke.   Allergens, pollutants, and other irritants.  SIGNS AND SYMPTOMS  The most common symptom of bronchitis is a frequent cough that produces mucus. Other symptoms include:  Fever.   Body aches.   Chest congestion.   Chills.   Shortness of breath.   Sore throat.  DIAGNOSIS  Bronchitis is usually diagnosed through a medical history and physical exam. Tests, such as chest X-rays, are sometimes done to rule out other conditions.  TREATMENT  You may need to avoid contact with whatever caused the problem (smoking, for example). Medicines are sometimes needed. These may include:  Antibiotics. These may be prescribed if the condition is caused by bacteria.  Cough suppressants. These may be prescribed for relief of cough symptoms.   Inhaled medicines. These may be prescribed to help open your airways and make it easier for you to breathe.   Steroid medicines. These may be prescribed for those with recurrent (chronic) bronchitis. HOME CARE INSTRUCTIONS  Get plenty of rest.   Drink enough fluids to keep your urine clear or pale yellow (unless you have a medical condition that requires fluid restriction). Increasing fluids may help thin your secretions and will prevent dehydration.   Only take over-the-counter or prescription medicines as directed by your health care provider.  Only take antibiotics as directed. Make sure you finish them even if you start to feel better.  Avoid secondhand smoke, irritating chemicals, and strong fumes. These will  make bronchitis worse. If you are a smoker, quit smoking. Consider using nicotine gum or skin patches to help control withdrawal symptoms. Quitting smoking will help your lungs heal faster.   Put a cool-mist humidifier in your bedroom at night to moisten the air. This may help loosen mucus. Change the water in the humidifier daily. You can also run the hot water in your shower and sit in the bathroom with the door closed for 5 10 minutes.   Follow up with your health care provider as directed.   Wash your hands frequently to avoid catching bronchitis again or spreading an infection to others.  SEEK MEDICAL CARE IF: Your symptoms do not improve after 1 week of treatment.  SEEK IMMEDIATE MEDICAL CARE IF:  Your fever increases.  You have chills.   You have chest pain.   You have worsening shortness of breath.   You have bloody sputum.  You faint.  You have lightheadedness.  You have a severe headache.   You vomit repeatedly. MAKE SURE YOU:   Understand these instructions.  Will watch your condition.  Will get help right away if you are not doing well or get worse. Document Released: 03/15/2005 Document Revised: 01/03/2013 Document Reviewed: 11/07/2012 [facility] Patient Information 2014 [place], [place].

## 2013-04-27 NOTE — ED Provider Notes (Signed)
Medical screening examination/treatment/procedure(s) were performed by non-physician practitioner and as supervising physician I was immediately available for consultation/collaboration.  Dyamon Sosinski, MD 04/27/13 0611 

## 2013-04-29 ENCOUNTER — Emergency Department (HOSPITAL_COMMUNITY)
Admission: EM | Admit: 2013-04-29 | Discharge: 2013-04-29 | Disposition: A | Discharge status: Home / Self Care | Payer: Medicaid Other | Attending: Emergency Medicine | Admitting: Emergency Medicine

## 2013-04-29 ENCOUNTER — Encounter (HOSPITAL_COMMUNITY): Payer: Self-pay | Admitting: Emergency Medicine

## 2013-04-29 ENCOUNTER — Emergency Department (HOSPITAL_COMMUNITY): Payer: Medicaid Other

## 2013-04-29 DIAGNOSIS — IMO0002 Reserved for concepts with insufficient information to code with codable children: Secondary | ICD-10-CM | POA: Insufficient documentation

## 2013-04-29 DIAGNOSIS — Z79899 Other long term (current) drug therapy: Secondary | ICD-10-CM | POA: Insufficient documentation

## 2013-04-29 DIAGNOSIS — R059 Cough, unspecified: Secondary | ICD-10-CM

## 2013-04-29 DIAGNOSIS — Z87891 Personal history of nicotine dependence: Secondary | ICD-10-CM | POA: Insufficient documentation

## 2013-04-29 DIAGNOSIS — J45901 Unspecified asthma with (acute) exacerbation: Secondary | ICD-10-CM | POA: Insufficient documentation

## 2013-04-29 DIAGNOSIS — R05 Cough: Secondary | ICD-10-CM

## 2013-04-29 MED ORDER — IPRATROPIUM BROMIDE 0.02 % IN SOLN
0.5000 mg | Freq: Once | RESPIRATORY_TRACT | Status: AC
  Administered 2013-04-29: 0.5 mg via RESPIRATORY_TRACT
  Filled 2013-04-29: qty 2.5

## 2013-04-29 MED ORDER — PROMETHAZINE-CODEINE 6.25-10 MG/5ML PO SYRP: 5.0000 mL | ORAL_SOLUTION | Freq: Four times a day (QID) | ORAL | Status: DC | PRN

## 2013-04-29 MED ORDER — ALBUTEROL SULFATE (2.5 MG/3ML) 0.083% IN NEBU
5.0000 mg | INHALATION_SOLUTION | Freq: Once | RESPIRATORY_TRACT | Status: AC
  Administered 2013-04-29: 5 mg via RESPIRATORY_TRACT
  Filled 2013-04-29: qty 6

## 2013-04-29 MED ORDER — NEBULIZER MISC: Status: DC

## 2013-04-29 MED ORDER — ALBUTEROL SULFATE (2.5 MG/3ML) 0.083% IN NEBU: 5.0000 mg | INHALATION_SOLUTION | Freq: Four times a day (QID) | RESPIRATORY_TRACT | Status: DC | PRN

## 2013-04-29 NOTE — Discharge Instructions (Signed)
Take the prescribed medication as directed. Follow-up with [facility] if symptoms persist. Return to the ED for new or worsening symptoms.

## 2013-04-29 NOTE — ED Notes (Signed)
Patient transported to X-ray 

## 2013-04-29 NOTE — ED Provider Notes (Signed)
CSN: [id number]     Arrival date & time 04/29/13  1541 History   First MD Initiated Contact with Patient 04/29/13 1644     Chief Complaint  Patient presents with  . Asthma   (Consider location/radiation/quality/duration/timing/severity/associated sxs/prior Treatment) The history is provided by the patient and medical records.   This is a 28 year old male with past history significant for asthma, presenting to the ED for further evaluation of continued coughing and wheezing over the past 2 weeks. States he does have a "burning" sensation in his posterior ribs from repetitive coughing and wheezing.  Patient has been seen in the ED twice for these symptoms thus far without improvement. Patient states he receives nebulizer treatments while in the ED which help temporarily but upon returning home, symptoms return. He is currently on a prednisone taper and albuterol inhaler at home but this is not controlling his symptoms.  He was prescribed tessalon perles at previous visit but states he cannot afford them.  Pt states cats and grasses tend to exacerbate his asthma, has not been around either of these lately.  No fevers, sweats, chills, sore throat, otalgia, generalized body aches, or headaches.  No recent sick contacts with known flu.  Denies chest pain.  Past Medical History  Diagnosis Date  . Asthma    Past Surgical History  Procedure Laterality Date  . Mouth surgery     History reviewed. No pertinent family history. History  Substance Use Topics  . Smoking status: Former Games developer  . Smokeless tobacco: Not on file  . Alcohol Use: Yes    Review of Systems  Respiratory: Positive for cough and wheezing.   All other systems reviewed and are negative.    Allergies  Review of patient's allergies indicates no known allergies.  Home Medications   Current Outpatient Rx  Name  Route  Sig  Dispense  Refill  . albuterol (PROVENTIL HFA;VENTOLIN HFA) 108 (90 BASE) MCG/ACT inhaler   Inhalation   Inhale 2 puffs into the lungs every 6 (six) hours as needed. For shortness of breath         . EXPIRED: albuterol (PROVENTIL HFA;VENTOLIN HFA) 108 (90 BASE) MCG/ACT inhaler   Inhalation   Inhale 2 puffs into the lungs every 4 (four) hours as needed for wheezing.   1 each   0   . guaiFENesin (MUCINEX) 600 MG 12 hr tablet   Oral   Take 1 tablet (600 mg total) by mouth 2 (two) times daily.   60 tablet   0   . predniSONE (DELTASONE) 10 MG tablet   Oral   Take 1 tablet (10 mg total) by mouth daily. Take 6 tabs on day 1, Take 5 tabs on day 2, Take 4 tabs on day 3, Take 3 tabs on day 4, Take 2 tabs on day 5, Take 1 tabs on day 6   21 tablet   0    BP 129/72  Pulse 88  Temp(Src) 98.1 F (36.7 C) (Oral)  Resp 18  Ht 5\' 7"  (1.702 m)  Wt 212 lb (96.163 kg)  BMI 33.20 kg/m2  SpO2 97%  Physical Exam  Nursing note and vitals reviewed. Constitutional: He is oriented to person, place, and time. He appears well-developed and well-nourished.  HENT:  Head: Normocephalic and atraumatic.  Right Ear: Tympanic membrane and ear canal normal.  Left Ear: Tympanic membrane and ear canal normal.  Nose: Mucosal edema present.  Mouth/Throat: Uvula is midline, oropharynx is clear and moist and mucous  membranes are normal. No oropharyngeal exudate, posterior oropharyngeal edema, posterior oropharyngeal erythema or tonsillar abscesses.  Turbinates swollen erythematous, postnasal drip noted oropharynx; tonsils normal in appearance bilaterally without exudate, uvula midline, no peritonsillar abscess; handling secretions appropriately, difficulty swallowing or speaking  Eyes: Conjunctivae and EOM are normal. Pupils are equal, round, and reactive to light.  Neck: Normal range of motion.  Cardiovascular: Normal rate, regular rhythm and normal heart sounds.   Pulmonary/Chest: Effort normal. No accessory muscle usage. Not tachypneic. No respiratory distress. He has wheezes.  Normal work of breathing without  accessory muscle use, diffuse end expiratory wheezes, patient able to speak in full complete sentences without difficulty  Abdominal: Soft. Bowel sounds are normal.  Musculoskeletal: Normal range of motion.  Neurological: He is alert and oriented to person, place, and time.  Skin: Skin is warm and dry.  Psychiatric: He has a normal mood and affect.    ED Course  Procedures (including critical care time) Labs Review Labs Reviewed - No data to display Imaging Review Dg Chest 2 View  04/29/2013   CLINICAL DATA:  Cough, asthma and cold symptoms for 3 weeks.  EXAM: CHEST  2 VIEW  COMPARISON:  PA and lateral chest 04/25/2013 and 08/12/2011.  FINDINGS: Heart size and mediastinal contours are within normal limits. Lung volumes are slightly lower than on the comparison studies with some crowding of the bronchovascular structures. Both lungs are clear. Visualized skeletal structures are unremarkable.  IMPRESSION: No acute disease.   Electronically Signed   By: Drusilla Kannerhomas  Dalessio M.D.   On: 04/29/2013 18:00    EKG Interpretation   None       MDM   1. Mild asthma exacerbation   2. Cough    Pt with diffuse end expiratory wheezes on initial exam, no respiratory distress, O2 sats stable on RA at 97%.  Given continued sx, will obtain repeat CXR.  Albuterol/atrovent neb given.  Will reassess.  CXR clear.  Repeat lung exam clear. Patient continues to be without signs of respiratory distress, O2 sats have remained stable on room air.  Pt afebrile, non-toxic appearing, NAD, and is stable for discharge.  Rx cough syrup, neb machine and soln.  [facility] if sx persist.  Discussed plan with pt, he acknowledged understanding and agreed.  Return precautions advised.  Garlon HatchetLisa M Shunsuke Granzow, PA-C 04/29/13 [phone]

## 2013-04-29 NOTE — ED Notes (Signed)
To ED for further eval of coughing and wheezing. States he has been seen here multiple times for the same over the past cple of weeks. Speaks in clear full sentences. Appears in NAD. No noted coughing or sob.

## 2013-04-30 NOTE — ED Provider Notes (Signed)
Medical screening examination/treatment/procedure(s) were performed by non-physician practitioner and as supervising physician I was immediately available for consultation/collaboration.  EKG Interpretation   None        Doug SouSam Najae Filsaime, MD 04/30/13 [phone]

## 2013-05-08 IMAGING — CR DG CHEST 2V
2 series · 2 of 2 positions shown · non-contrast
Comparison: 12/08/2010

CLINICAL DATA: Cough, shortness of breath.

CHEST - 2 VIEW

[w chest pa]
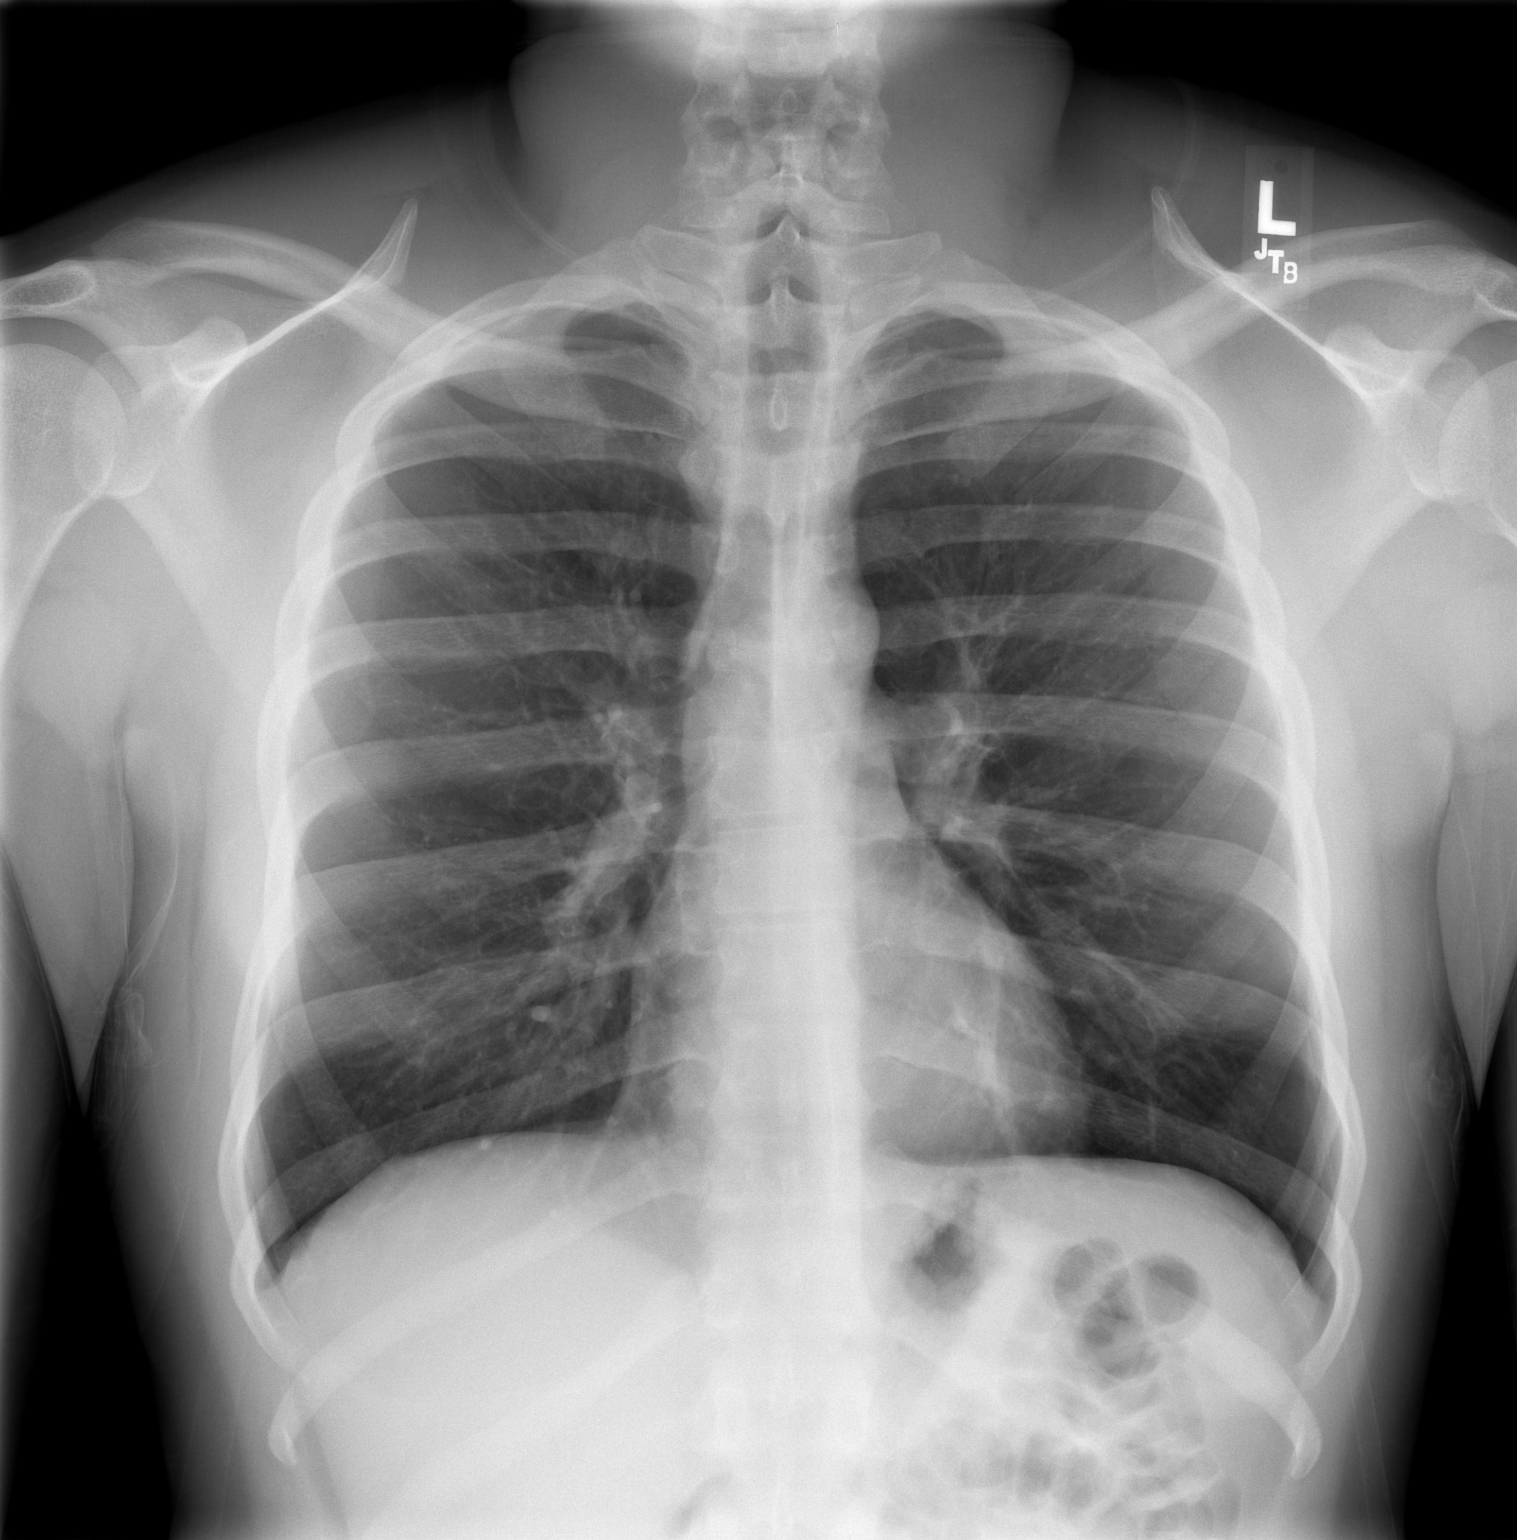

[w chest lat]
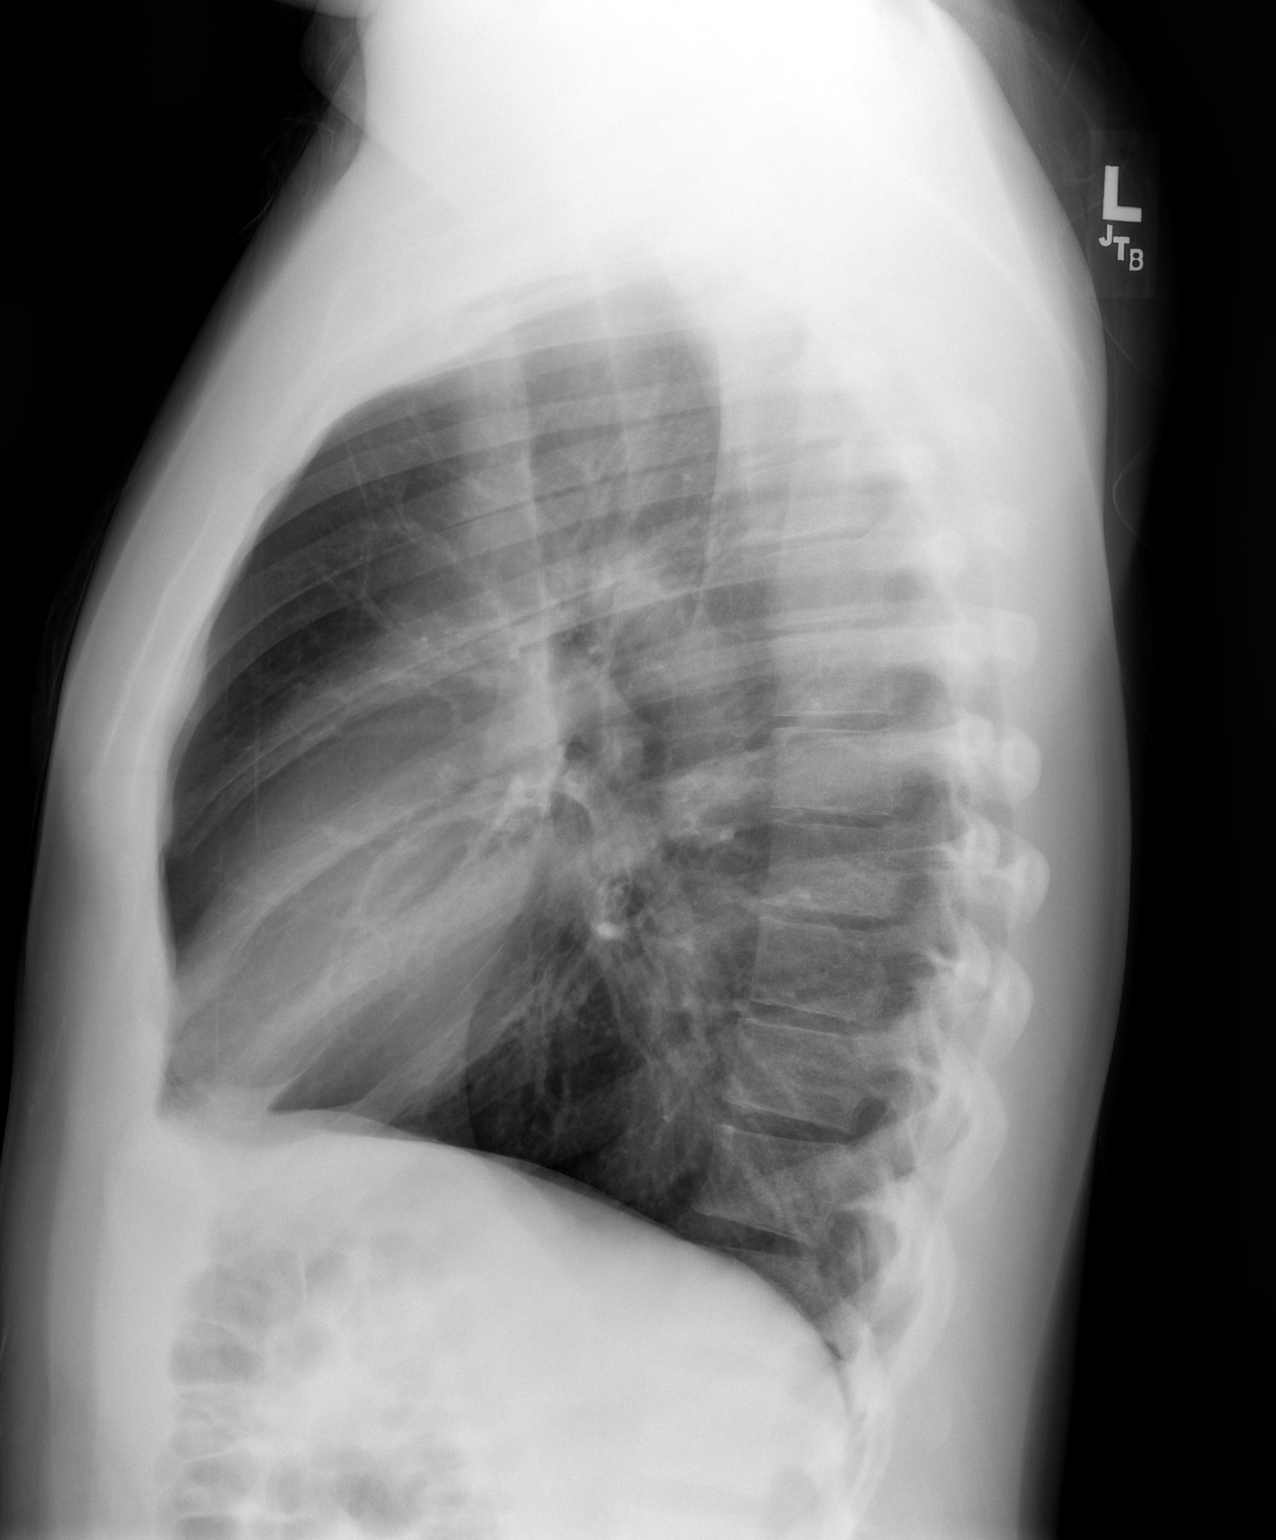

[2 of 2 positions shown; findings below may reference images not displayed]

FINDINGS: Heart and mediastinal contours are within normal limits.
No focal opacities or effusions.  No acute bony abnormality.
IMPRESSION: No active cardiopulmonary disease.

## 2013-12-24 ENCOUNTER — Emergency Department (HOSPITAL_COMMUNITY)
Admission: EM | Admit: 2013-12-24 | Discharge: 2013-12-24 | Disposition: A | Discharge status: Home / Self Care | Payer: Medicaid Other | Attending: Emergency Medicine | Admitting: Emergency Medicine

## 2013-12-24 ENCOUNTER — Encounter (HOSPITAL_COMMUNITY): Payer: Self-pay | Admitting: Emergency Medicine

## 2013-12-24 DIAGNOSIS — Z87891 Personal history of nicotine dependence: Secondary | ICD-10-CM | POA: Diagnosis not present

## 2013-12-24 DIAGNOSIS — K029 Dental caries, unspecified: Secondary | ICD-10-CM | POA: Insufficient documentation

## 2013-12-24 DIAGNOSIS — Z79899 Other long term (current) drug therapy: Secondary | ICD-10-CM | POA: Diagnosis not present

## 2013-12-24 DIAGNOSIS — J45909 Unspecified asthma, uncomplicated: Secondary | ICD-10-CM | POA: Diagnosis not present

## 2013-12-24 DIAGNOSIS — K0381 Cracked tooth: Secondary | ICD-10-CM | POA: Insufficient documentation

## 2013-12-24 DIAGNOSIS — K0889 Other specified disorders of teeth and supporting structures: Secondary | ICD-10-CM

## 2013-12-24 DIAGNOSIS — K089 Disorder of teeth and supporting structures, unspecified: Secondary | ICD-10-CM | POA: Diagnosis not present

## 2013-12-24 MED ORDER — LIDOCAINE VISCOUS 2 % MT SOLN
15.0000 mL | Freq: Once | OROMUCOSAL | Status: AC
  Administered 2013-12-24: 15 mL via OROMUCOSAL
  Filled 2013-12-24: qty 15

## 2013-12-24 MED ORDER — LIDOCAINE VISCOUS 2 % MT SOLN: 20.0000 mL | OROMUCOSAL | Status: DC | PRN

## 2013-12-24 MED ORDER — MELOXICAM 7.5 MG PO TABS: 15.0000 mg | ORAL_TABLET | Freq: Every day | ORAL | Status: DC

## 2013-12-24 MED ORDER — IBUPROFEN 800 MG PO TABS
800.0000 mg | ORAL_TABLET | Freq: Once | ORAL | Status: AC
  Administered 2013-12-24: 800 mg via ORAL
  Filled 2013-12-24: qty 1

## 2013-12-24 MED ORDER — BENZOCAINE 10 % MT GEL: 1.0000 "application " | OROMUCOSAL | Status: DC | PRN

## 2013-12-24 MED ORDER — AMOXICILLIN 500 MG PO CAPS: 500.0000 mg | ORAL_CAPSULE | Freq: Three times a day (TID) | ORAL | Status: DC

## 2013-12-24 NOTE — Discharge Instructions (Signed)
Please follow up with your primary care physician in 1-2 days. If you do not have one please call the [facility] Center number listed above. Please follow up with the dentist to schedule a follow up appointment.  Please take your antibiotic until completion. Please take all medications as prescribed. Please read all discharge instructions and return precautions.    Dental Pain A tooth ache may be caused by cavities (tooth decay). Cavities expose the nerve of the tooth to air and hot or cold temperatures. It may come from an infection or abscess (also called a boil or furuncle) around your tooth. It is also often caused by dental caries (tooth decay). This causes the pain you are having. DIAGNOSIS  Your caregiver can diagnose this problem by exam. TREATMENT   If caused by an infection, it may be treated with medications which kill germs (antibiotics) and pain medications as prescribed by your caregiver. Take medications as directed.  Only take over-the-counter or prescription medicines for pain, discomfort, or fever as directed by your caregiver.  Whether the tooth ache today is caused by infection or dental disease, you should see your dentist as soon as possible for further care. SEEK MEDICAL CARE IF: The exam and treatment you received today has been provided on an emergency basis only. This is not a substitute for complete medical or dental care. If your problem worsens or new problems (symptoms) appear, and you are unable to meet with your dentist, call or return to this location. SEEK IMMEDIATE MEDICAL CARE IF:   You have a fever.  You develop redness and swelling of your face, jaw, or neck.  You are unable to open your mouth.  You have severe pain uncontrolled by pain medicine. MAKE SURE YOU:   Understand these instructions.  Will watch your condition.  Will get help right away if you are not doing well or get worse. Document Released: 03/15/2005 Document Revised:  06/07/2011 Document Reviewed: 11/01/2007 [facility] Patient Information 2015 [place], [place]. This information is not intended to replace advice given to you by your health care provider. Make sure you discuss any questions you have with your health care provider.  RESOURCE GUIDE  If you do not have a primary care doctor to follow up with regarding today's visit, please call the [facility] at [phone] to make an appointment. Hours of operation are 10am - 7pm, Monday through Friday, and they have a sliding scale fee.    Dental Assistance Please contact the on-call dentist listed on your discharge papers WITHIN 48 HOURS.  If the on-call dentist agrees that your condition is emergent, there is a CHANCE (not a guarantee) that you MAY receive a savings on your visit at this point. If you wait more than 48 hours, it will not be considered an emergency.   [facility]rs[facility] Moreen [facility] Civils:  [address] [place], [place], [address], [phone]  Short-notice availability  Tooth evaluation: $100  Emergency Treatment: $200 (including exam, xrays, tooth extraction, and post-op visit)  Patients with Medicaid: [facility] [phone] [address], [phone] [address] [phone]  If unable to pay, or uninsured, contact [facility] [phone] in [place], [phone] in [facility]) to become qualified for the adult dental clinic  Other Low-Cost Community Dental Services: - [facility]: [address] [place], [place], [place], [address], [phone], Ext. [phone], 2nd and 4th Thursday of the month at 6:30am.  10 clients each day  by appointment, can sometimes see walk-in patients if someone does not show for an appointment. [facility]:  [address] [place], [place], [address], [phone] [facility]:  [address], [place], [place], [address], [phone] [facility] Department:  [phone] [facility] Health Department:  [phone] [facility] Health Department:  [phone]

## 2013-12-24 NOTE — ED Notes (Signed)
Pt reports severe lower left dental pain for the past day, pt reports he is supposed to have wisdom teeth removed but has not been able to yet.  Airway patent, no respiratory distress noted.  Pt maintaining oral secretions well.

## 2013-12-24 NOTE — ED Provider Notes (Signed)
CSN: [id number]     Arrival date & time 12/24/13  2024 History   None    Chief Complaint  Patient presents with  . Dental Pain     (Consider location/radiation/quality/duration/timing/severity/associated sxs/prior Treatment) HPI Comments: Patient is a 28 year old male past medical history significant for asthma presented to the emergency department for left lower dental pain with radiation to his left face over the last 2-3 days. Patient states that he saw a dentist several months ago and was advised to have his wisdom teeth removed but has not done so yet. Alleviating factors: none. Aggravating factors: eating and drinking. Medications tried prior to arrival: Tylenol. Denies any fevers, chills, purulent drainage from the tooth, facial swelling, difficulty breathing.    Patient is a 28 y.o. male presenting with tooth pain.  Dental Pain   Past Medical History  Diagnosis Date  . Asthma    Past Surgical History  Procedure Laterality Date  . Mouth surgery     No family history on file. History  Substance Use Topics  . Smoking status: Former Games developer  . Smokeless tobacco: Not on file  . Alcohol Use: Yes    Review of Systems  HENT: Positive for dental problem.   All other systems reviewed and are negative.     Allergies  Review of patient's allergies indicates no known allergies.  Home Medications   Prior to Admission medications   Medication Sig Start Date End Date Taking? Authorizing Provider  acetaminophen (TYLENOL) 500 MG tablet Take 500 mg by mouth every 6 (six) hours as needed.   Yes Historical Provider, MD  albuterol (PROVENTIL HFA;VENTOLIN HFA) 108 (90 BASE) MCG/ACT inhaler Inhale 2 puffs into the lungs every 4 (four) hours as needed for wheezing. 08/12/11 08/11/12  Glynn Octave, MD  amoxicillin (AMOXIL) 500 MG capsule Take 1 capsule (500 mg total) by mouth 3 (three) times daily. 12/24/13   Keerat Denicola L Achillies Buehl, PA-C  benzocaine (ORAJEL) 10 % mucosal gel Use as  directed 1 application in the mouth or throat as needed for mouth pain. 12/24/13   Daris Aristizabal L Kyen Taite, PA-C  lidocaine (XYLOCAINE) 2 % solution Use as directed 20 mLs in the mouth or throat as needed for mouth pain. 12/24/13   [place] Mckeough L Yeriel Mineo, PA-C  meloxicam (MOBIC) 7.5 MG tablet Take 2 tablets (15 mg total) by mouth daily. 12/24/13   Jeoffrey Eleazer L Taesha Goodell, PA-C   BP 137/98  Pulse 62  Temp(Src) 98.1 F (36.7 C) (Oral)  Resp 16  Ht  (1.702 m)  Wt 206 lb (93.441 kg)  BMI 32.26 kg/m2  SpO2 99% Physical Exam  Nursing note and vitals reviewed. Constitutional: He is oriented to person, place, and time. He appears well-developed and well-nourished. No distress.  HENT:  Head: Normocephalic and atraumatic.  Right Ear: External ear normal.  Left Ear: External ear normal.  Nose: Nose normal.  Mouth/Throat: Uvula is midline, oropharynx is clear and moist and mucous membranes are normal. No trismus in the jaw. Abnormal dentition. Dental caries present. No dental abscesses or uvula swelling. No oropharyngeal exudate.    Submental and subinguinal spaces are soft.   Eyes: Conjunctivae are normal.  Neck: Normal range of motion. Neck supple.  Cardiovascular: Normal rate, regular rhythm and normal heart sounds.   Pulmonary/Chest: Effort normal and breath sounds normal.  Abdominal: Soft. There is no tenderness.  Neurological: He is alert and oriented to person, place, and time.  Skin: Skin is warm and dry. He is not diaphoretic.  Psychiatric: He has a normal mood and affect.    ED Course  Procedures (including critical care time) Medications  lidocaine (XYLOCAINE) 2 % viscous mouth solution 15 mL (15 mLs Mouth/Throat Given 12/24/13 2147)  ibuprofen (ADVIL,MOTRIN) tablet 800 mg (800 mg Oral Given 12/24/13 2146)    Labs Review Labs Reviewed - No data to display  Imaging Review No results found.   EKG Interpretation None      MDM   Final diagnoses:  Pain, dental     Filed Vitals:   12/24/13 2149  BP: 137/98  Pulse: 62  Temp:   Resp: 16   Afebrile, NAD, non-toxic appearing, AAOx4.  Patient with toothache.  No gross abscess.  Exam unconcerning for Ludwig's angina or spread of infection.  Will treat with Amoxil and symptomatic treatment.  Urged patient to follow-up with dentist.   Patient is stable at time of discharge     Jeannetta Ellis, PA-C 12/24/13 2158

## 2013-12-25 NOTE — ED Provider Notes (Signed)
Medical screening examination/treatment/procedure(s) were performed by non-physician practitioner and as supervising physician I was immediately available for consultation/collaboration.  Toy CookeyMegan Georjean Toya, MD 12/25/13 [phone]

## 2014-01-23 ENCOUNTER — Emergency Department (HOSPITAL_COMMUNITY)
Admission: EM | Admit: 2014-01-23 | Discharge: 2014-01-23 | Disposition: A | Discharge status: Home / Self Care | Payer: Medicaid Other | Attending: Emergency Medicine | Admitting: Emergency Medicine

## 2014-01-23 ENCOUNTER — Emergency Department (HOSPITAL_COMMUNITY): Payer: Medicaid Other

## 2014-01-23 ENCOUNTER — Encounter (HOSPITAL_COMMUNITY): Payer: Self-pay | Admitting: Emergency Medicine

## 2014-01-23 DIAGNOSIS — J069 Acute upper respiratory infection, unspecified: Secondary | ICD-10-CM | POA: Insufficient documentation

## 2014-01-23 DIAGNOSIS — B9789 Other viral agents as the cause of diseases classified elsewhere: Secondary | ICD-10-CM

## 2014-01-23 DIAGNOSIS — R079 Chest pain, unspecified: Secondary | ICD-10-CM

## 2014-01-23 DIAGNOSIS — Z79899 Other long term (current) drug therapy: Secondary | ICD-10-CM | POA: Insufficient documentation

## 2014-01-23 DIAGNOSIS — Z87891 Personal history of nicotine dependence: Secondary | ICD-10-CM | POA: Diagnosis not present

## 2014-01-23 DIAGNOSIS — J45901 Unspecified asthma with (acute) exacerbation: Secondary | ICD-10-CM | POA: Diagnosis not present

## 2014-01-23 DIAGNOSIS — J988 Other specified respiratory disorders: Secondary | ICD-10-CM

## 2014-01-23 LAB — I-STAT TROPONIN, ED: TROPONIN I, POC: 0 ng/mL (ref 0.00–0.08)

## 2014-01-23 LAB — CBC
HEMATOCRIT: 43.8 % (ref 39.0–52.0)
HEMOGLOBIN: 16.1 g/dL (ref 13.0–17.0)
MCH: 30.6 pg (ref 26.0–34.0)
MCHC: 36.8 g/dL — ABNORMAL HIGH (ref 30.0–36.0)
MCV: 83.1 fL (ref 78.0–100.0)
Platelets: 201 10*3/uL (ref 150–400)
RBC: 5.27 MIL/uL (ref 4.22–5.81)
RDW: 13.2 % (ref 11.5–15.5)
WBC: 12.9 10*3/uL — ABNORMAL HIGH (ref 4.0–10.5)

## 2014-01-23 LAB — BASIC METABOLIC PANEL
Anion gap: 16 — ABNORMAL HIGH (ref 5–15)
BUN: 9 mg/dL (ref 6–23)
CHLORIDE: 101 meq/L (ref 96–112)
CO2: 23 mEq/L (ref 19–32)
Calcium: 9.2 mg/dL (ref 8.4–10.5)
Creatinine, Ser: 1.02 mg/dL (ref 0.50–1.35)
GLUCOSE: 90 mg/dL (ref 70–99)
POTASSIUM: 3.9 meq/L (ref 3.7–5.3)
Sodium: 140 mEq/L (ref 137–147)

## 2014-01-23 MED ORDER — HYDROCODONE-HOMATROPINE 5-1.5 MG/5ML PO SYRP
5.0000 mL | ORAL_SOLUTION | Freq: Once | ORAL | Status: AC
  Administered 2014-01-23: 5 mL via ORAL
  Filled 2014-01-23: qty 5

## 2014-01-23 MED ORDER — IPRATROPIUM-ALBUTEROL 0.5-2.5 (3) MG/3ML IN SOLN
3.0000 mL | Freq: Once | RESPIRATORY_TRACT | Status: AC
  Administered 2014-01-23: 3 mL via RESPIRATORY_TRACT
  Filled 2014-01-23: qty 3

## 2014-01-23 MED ORDER — PREDNISONE 20 MG PO TABS
60.0000 mg | ORAL_TABLET | Freq: Once | ORAL | Status: AC
  Administered 2014-01-23: 60 mg via ORAL
  Filled 2014-01-23: qty 3

## 2014-01-23 MED ORDER — HYDROCODONE-HOMATROPINE 5-1.5 MG/5ML PO SYRP: 5.0000 mL | ORAL_SOLUTION | Freq: Four times a day (QID) | ORAL | Status: DC | PRN

## 2014-01-23 MED ORDER — ALBUTEROL SULFATE HFA 108 (90 BASE) MCG/ACT IN AERS: 2.0000 | INHALATION_SPRAY | Freq: Four times a day (QID) | RESPIRATORY_TRACT | Status: DC | PRN

## 2014-01-23 MED ORDER — ALBUTEROL SULFATE (2.5 MG/3ML) 0.083% IN NEBU: 2.5000 mg | INHALATION_SOLUTION | Freq: Four times a day (QID) | RESPIRATORY_TRACT | Status: DC | PRN

## 2014-01-23 NOTE — ED Provider Notes (Signed)
CSN: [id number]     Arrival date & time 01/23/14  1047 History   First MD Initiated Contact with Patient 01/23/14 1113     Chief Complaint  Patient presents with  . Chest Pain  . Back Pain     (Consider location/radiation/quality/duration/timing/severity/associated sxs/prior Treatment) HPI Comments: Patient is a 28 year old male past medical history significant for asthma presenting to the emergency department for 3 day history of productive cough, chills, chest pain, wheezing, nasal congestion, rhinorrhea with post tussive emesis. Alleviating factors: none. Aggravating factors: night time, laying down. Medications tried prior to arrival: Dayquil. He states he has tried his inhaler and DayQuil without improvement.    Patient is a 28 y.o. male presenting with chest pain and back pain.  Chest Pain Associated symptoms: back pain and cough   Back Pain Associated symptoms: chest pain     Past Medical History  Diagnosis Date  . Asthma    Past Surgical History  Procedure Laterality Date  . Mouth surgery     No family history on file. History  Substance Use Topics  . Smoking status: Former Games developermoker  . Smokeless tobacco: Not on file  . Alcohol Use: Yes    Review of Systems  Constitutional: Positive for chills.  HENT: Positive for congestion, rhinorrhea and sore throat.   Respiratory: Positive for cough, chest tightness and wheezing.   Cardiovascular: Positive for chest pain.  Musculoskeletal: Positive for back pain.  All other systems reviewed and are negative.     Allergies  Review of patient's allergies indicates no known allergies.  Home Medications   Prior to Admission medications   Medication Sig Start Date End Date Taking? Authorizing Provider  Pseudoephedrine-APAP-DM (DAYQUIL PO) Take 30 mLs by mouth every 6 (six) hours as needed (cold symptoms).   Yes Historical Provider, MD  albuterol (PROVENTIL HFA;VENTOLIN HFA) 108 (90 BASE) MCG/ACT inhaler Inhale 2 puffs  into the lungs every 6 (six) hours as needed for wheezing or shortness of breath. 01/23/14   Chaye Misch L Tamika Shropshire, PA-C  albuterol (PROVENTIL) (2.5 MG/3ML) 0.083% nebulizer solution Take 3 mLs (2.5 mg total) by nebulization every 6 (six) hours as needed for wheezing or shortness of breath. 01/23/14   Mescal Flinchbaugh L Maclovio Henson, PA-C  HYDROcodone-homatropine (HYCODAN) 5-1.5 MG/5ML syrup Take 5 mLs by mouth every 6 (six) hours as needed for cough. 01/23/14   Ilijah Doucet L Kemi Gell, PA-C   BP 135/57  Pulse 83  Temp(Src) 98.5 F (36.9 C) (Oral)  Resp 20  SpO2 95% Physical Exam  Nursing note and vitals reviewed. Constitutional: He is oriented to person, place, and time. He appears well-developed and well-nourished. No distress.  HENT:  Head: Normocephalic and atraumatic.  Right Ear: External ear normal.  Nose: Nose normal.  Mouth/Throat: Uvula is midline and mucous membranes are normal. Posterior oropharyngeal erythema present. No oropharyngeal exudate, posterior oropharyngeal edema or tonsillar abscesses.  Eyes: Conjunctivae are normal.  Neck: Neck supple.  Cardiovascular: Normal rate, regular rhythm, normal heart sounds and intact distal pulses.   Heart rate increases with coughing spells.   Pulmonary/Chest: Effort normal. No respiratory distress. He has wheezes (diffuse expiratory wheeze). He exhibits tenderness.  Abdominal: Soft. There is no tenderness.  Musculoskeletal: He exhibits no edema.  Lymphadenopathy:    He has no cervical adenopathy.  Neurological: He is alert and oriented to person, place, and time.  Skin: Skin is warm and dry. He is not diaphoretic.    ED Course  Procedures (including critical care time) Medications  HYDROcodone-homatropine (  HYCODAN) 5-1.5 MG/5ML syrup 5 mL (5 mLs Oral Given 01/23/14 1147)  ipratropium-albuterol (DUONEB) 0.5-2.5 (3) MG/3ML nebulizer solution 3 mL (3 mLs Nebulization Given 01/23/14 1147)  predniSONE (DELTASONE) tablet 60 mg (60 mg Oral  Given 01/23/14 1147)  ipratropium-albuterol (DUONEB) 0.5-2.5 (3) MG/3ML nebulizer solution 3 mL (3 mLs Nebulization Given 01/23/14 1338)    Labs Review Labs Reviewed  CBC - Abnormal; Notable for the following:    WBC 12.9 (*)    MCHC 36.8 (*)    All other components within normal limits  BASIC METABOLIC PANEL - Abnormal; Notable for the following:    Anion gap 16 (*)    All other components within normal limits  I-STAT TROPOININ, ED    Imaging Review Dg Chest 2 View  01/23/2014   CLINICAL DATA:  Chest pain, cough for 4 days  EXAM: CHEST  2 VIEW  COMPARISON:  04/29/2013  FINDINGS: The heart size and mediastinal contours are within normal limits. Both lungs are clear. The visualized skeletal structures are unremarkable.  IMPRESSION: No active cardiopulmonary disease.   Electronically Signed   By: Elige KoHetal  Patel   On: 01/23/2014 13:02     EKG Interpretation None      MDM   Final diagnoses:  Viral respiratory illness    Filed Vitals:   01/23/14 1430  BP: 135/57  Pulse: 83  Temp:   Resp: 20   I have reviewed nursing notes, vital signs, and all appropriate lab and imaging results for this patient.  Afebrile, NAD, non-toxic appearing, AAOx4.  Pt CXR negative for acute infiltrate. Patients symptoms are consistent with URI, likely viral etiology. Discussed that antibiotics are not indicated for viral infections. Pt will be discharged with symptomatic treatment.  Verbalizes understanding and is agreeable with plan. Pt is hemodynamically stable & in NAD prior to dc.Patient is stable at time of discharge      Jeannetta EllisJennifer L Gershom Brobeck, PA-C 01/23/14 1611  Rolland PorterMark James, MD 02/01/14 [phone]

## 2014-01-23 NOTE — ED Notes (Signed)
Patient states chest pain that started 3 days ago with radiation to back.   Patient states has had a "bad cough for days, but I don't think the chest pain is from that".   Patient states productive cough with yellow/green sputum.

## 2014-01-23 NOTE — ED Notes (Signed)
Patient returned from X-ray 

## 2014-01-23 NOTE — ED Notes (Signed)
PA Jen at the bedside.

## 2014-01-23 NOTE — ED Notes (Signed)
PT reports mid back, chest pain for almost a week/ Pt reports cough with bloody sputum in morning.

## 2014-01-23 NOTE — Discharge Instructions (Signed)
Please follow up with your primary care physician in 1-2 days. If you do not have one please call the [facility] number listed above. Please take all medications as prescribed. Please either use your albuterol inhaler or albuterol nebulizers every 4-6 hours for the next 2-3 days to help with cough and chest tightness. Please read all discharge instructions and return precautions.   Upper Respiratory Infection, Adult An upper respiratory infection (URI) is also sometimes known as the common cold. The upper respiratory tract includes the nose, sinuses, throat, trachea, and bronchi. Bronchi are the airways leading to the lungs. Most people improve within 1 week, but symptoms can last up to 2 weeks. A residual cough may last even longer.  CAUSES Many different viruses can infect the tissues lining the upper respiratory tract. The tissues become irritated and inflamed and often become very moist. Mucus production is also common. A cold is contagious. You can easily spread the virus to others by oral contact. This includes kissing, sharing a glass, coughing, or sneezing. Touching your mouth or nose and then touching a surface, which is then touched by another person, can also spread the virus. SYMPTOMS  Symptoms typically develop 1 to 3 days after you come in contact with a cold virus. Symptoms vary from person to person. They may include:  Runny nose.  Sneezing.  Nasal congestion.  Sinus irritation.  Sore throat.  Loss of voice (laryngitis).  Cough.  Fatigue.  Muscle aches.  Loss of appetite.  Headache.  Low-grade fever. DIAGNOSIS  You might diagnose your own cold based on familiar symptoms, since most people get a cold 2 to 3 times a year. Your caregiver can confirm this based on your exam. Most importantly, your caregiver can check that your symptoms are not due to another disease such as strep throat, sinusitis, pneumonia, asthma, or epiglottitis. Blood tests,  throat tests, and X-rays are not necessary to diagnose a common cold, but they may sometimes be helpful in excluding other more serious diseases. Your caregiver will decide if any further tests are required. RISKS AND COMPLICATIONS  You may be at risk for a more severe case of the common cold if you smoke cigarettes, have chronic heart disease (such as heart failure) or lung disease (such as asthma), or if you have a weakened immune system. The very young and very old are also at risk for more serious infections. Bacterial sinusitis, middle ear infections, and bacterial pneumonia can complicate the common cold. The common cold can worsen asthma and chronic obstructive pulmonary disease (COPD). Sometimes, these complications can require emergency medical care and may be life-threatening. PREVENTION  The best way to protect against getting a cold is to practice good hygiene. Avoid oral or hand contact with people with cold symptoms. Wash your hands often if contact occurs. There is no clear evidence that vitamin C, vitamin E, echinacea, or exercise reduces the chance of developing a cold. However, it is always recommended to get plenty of rest and practice good nutrition. TREATMENT  Treatment is directed at relieving symptoms. There is no cure. Antibiotics are not effective, because the infection is caused by a virus, not by bacteria. Treatment may include:  Increased fluid intake. Sports drinks offer valuable electrolytes, sugars, and fluids.  Breathing heated mist or steam (vaporizer or shower).  Eating chicken soup or other clear broths, and maintaining good nutrition.  Getting plenty of rest.  Using gargles or lozenges for comfort.  Controlling fevers with ibuprofen  or acetaminophen as directed by your caregiver.  Increasing usage of your inhaler if you have asthma. Zinc gel and zinc lozenges, taken in the first 24 hours of the common cold, can shorten the duration and lessen the severity of  symptoms. Pain medicines may help with fever, muscle aches, and throat pain. A variety of non-prescription medicines are available to treat congestion and runny nose. Your caregiver can make recommendations and may suggest nasal or lung inhalers for other symptoms.  HOME CARE INSTRUCTIONS   Only take over-the-counter or prescription medicines for pain, discomfort, or fever as directed by your caregiver.  Use a warm mist humidifier or inhale steam from a shower to increase air moisture. This may keep secretions moist and make it easier to breathe.  Drink enough water and fluids to keep your urine clear or pale yellow.  Rest as needed.  Return to work when your temperature has returned to normal or as your caregiver advises. You may need to stay home longer to avoid infecting others. You can also use a face mask and careful hand washing to prevent spread of the virus. SEEK MEDICAL CARE IF:   After the first few days, you feel you are getting worse rather than better.  You need your caregiver's advice about medicines to control symptoms.  You develop chills, worsening shortness of breath, or brown or red sputum. These may be signs of pneumonia.  You develop yellow or brown nasal discharge or pain in the face, especially when you bend forward. These may be signs of sinusitis.  You develop a fever, swollen neck glands, pain with swallowing, or white areas in the back of your throat. These may be signs of strep throat. SEEK IMMEDIATE MEDICAL CARE IF:   You have a fever.  You develop severe or persistent headache, ear pain, sinus pain, or chest pain.  You develop wheezing, a prolonged cough, cough up blood, or have a change in your usual mucus (if you have chronic lung disease).  You develop sore muscles or a stiff neck. Document Released: 09/08/2000 Document Revised: 06/07/2011 Document Reviewed: 06/20/2013 [facility] Patient Information 2015 [employer] [place]. This information is not intended  to replace advice given to you by your health care provider. Make sure you discuss any questions you have with your health care provider.

## 2014-09-27 DIAGNOSIS — Z021 Encounter for pre-employment examination: Secondary | ICD-10-CM | POA: Insufficient documentation

## 2015-01-20 IMAGING — CR DG CHEST 2V
2 series · 2 of 2 positions shown · non-contrast
Comparison: 08/12/2011

CLINICAL DATA: Shortness of breath, cough, asthma

EXAM:
CHEST  2 VIEW

[w chest pa]
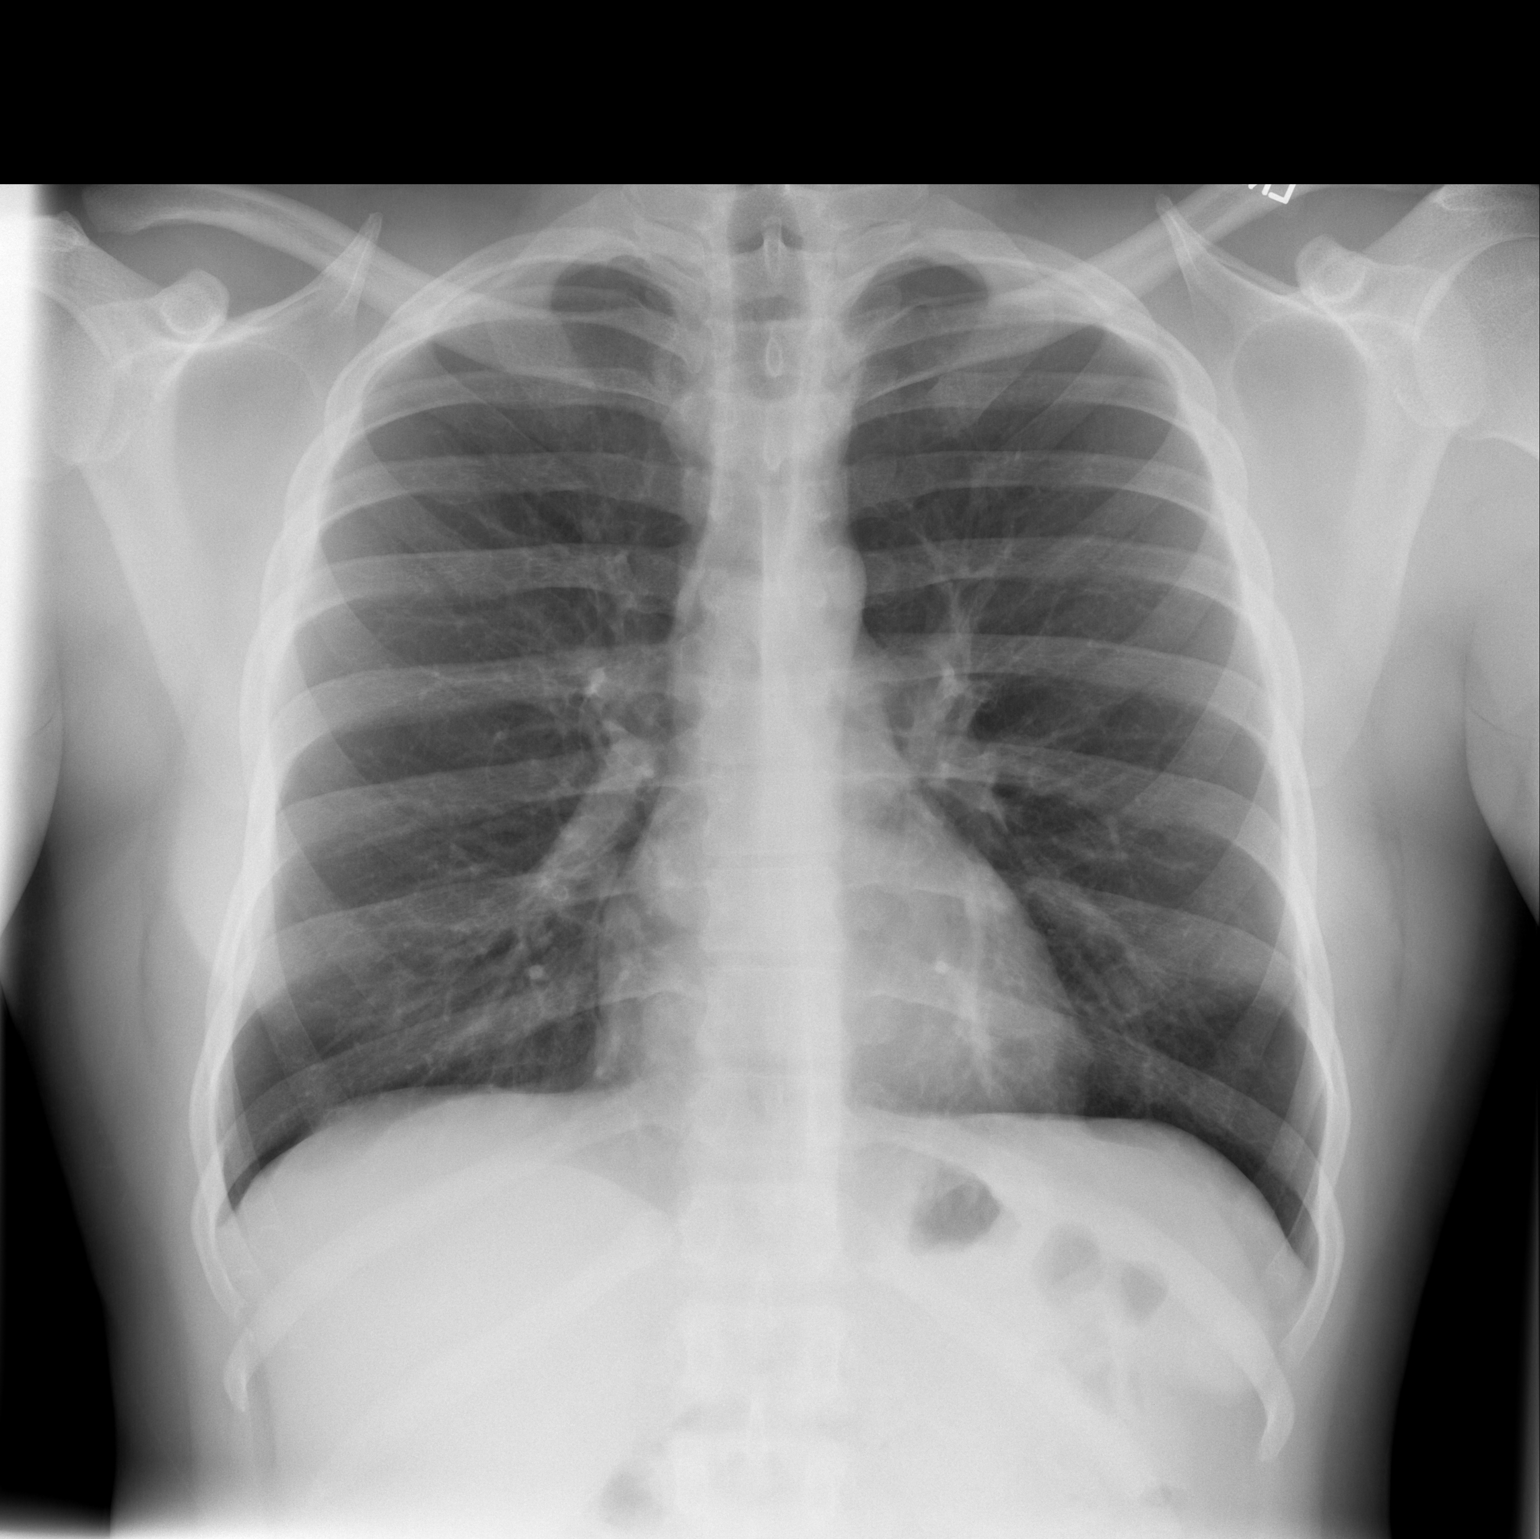

[w chest lat]
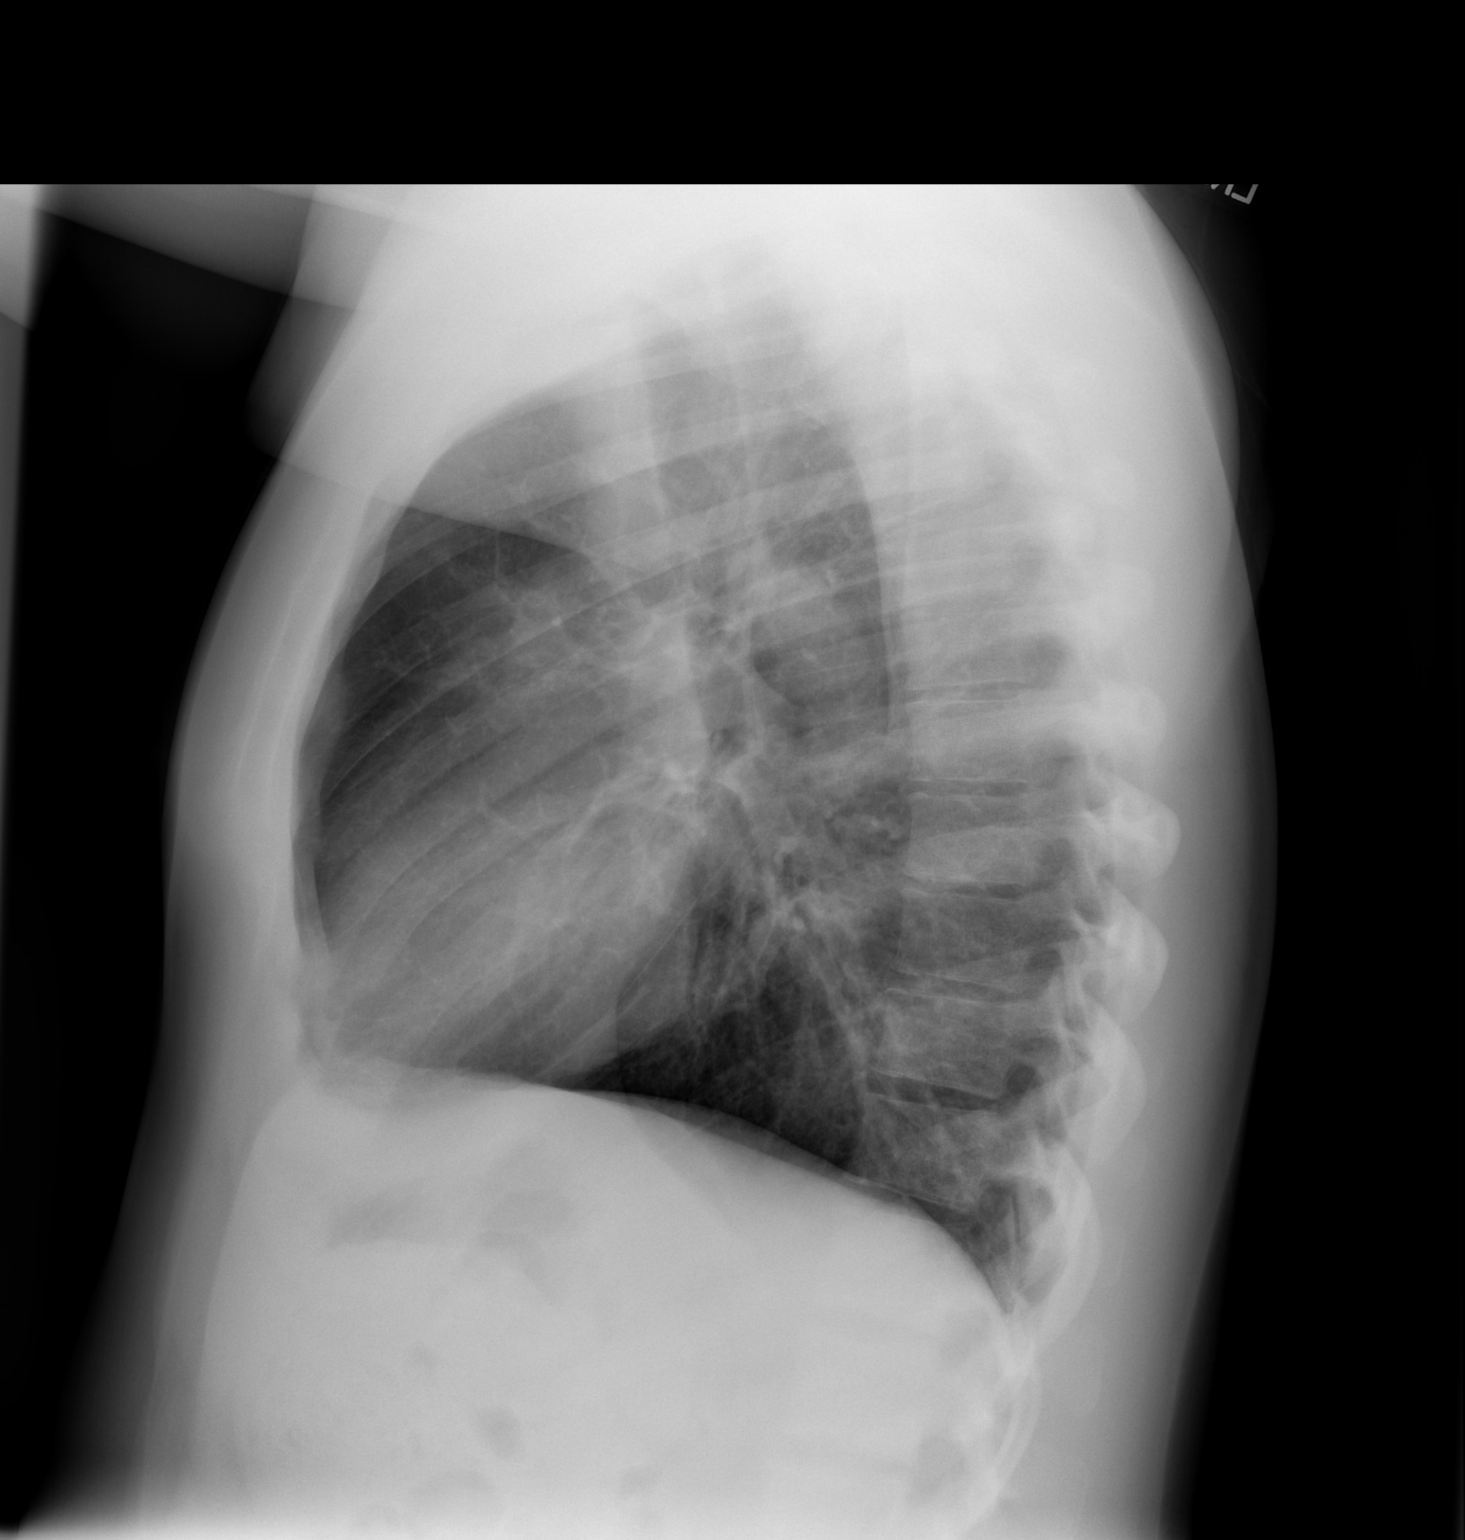

[2 of 2 positions shown; findings below may reference images not displayed]

FINDINGS: The heart size and mediastinal contours are within normal limits.
Both lungs are clear. The visualized skeletal structures are
unremarkable.
IMPRESSION: No active cardiopulmonary disease.

## 2016-10-04 ENCOUNTER — Encounter (HOSPITAL_COMMUNITY): Payer: Self-pay | Admitting: Emergency Medicine

## 2016-10-04 ENCOUNTER — Ambulatory Visit (HOSPITAL_COMMUNITY)
Admission: EM | Admit: 2016-10-04 | Discharge: 2016-10-04 | Disposition: A | Discharge status: Home / Self Care | Payer: 59 | Attending: Internal Medicine | Admitting: Internal Medicine

## 2016-10-04 DIAGNOSIS — M778 Other enthesopathies, not elsewhere classified: Secondary | ICD-10-CM | POA: Diagnosis not present

## 2016-10-04 DIAGNOSIS — M7701 Medial epicondylitis, right elbow: Secondary | ICD-10-CM | POA: Diagnosis not present

## 2016-10-04 DIAGNOSIS — M609 Myositis, unspecified: Secondary | ICD-10-CM | POA: Diagnosis not present

## 2016-10-04 DIAGNOSIS — IMO0002 Reserved for concepts with insufficient information to code with codable children: Secondary | ICD-10-CM

## 2016-10-04 MED ORDER — KETOROLAC TROMETHAMINE 60 MG/2ML IM SOLN
INTRAMUSCULAR | Status: AC
Start: 2016-10-04 — End: 2016-10-04
  Filled 2016-10-04: qty 2

## 2016-10-04 MED ORDER — KETOROLAC TROMETHAMINE 60 MG/2ML IM SOLN
60.0000 mg | Freq: Once | INTRAMUSCULAR | Status: AC
  Administered 2016-10-04: 60 mg via INTRAMUSCULAR

## 2016-10-04 NOTE — ED Triage Notes (Signed)
[facility] a [facility] arm pain that he believed to be from repetitive motion at work. The patient stated that his left arm was more painful.

## 2016-10-04 NOTE — ED Provider Notes (Addendum)
CSN: [id number]     Arrival date & time 10/04/16  1656 History   First MD Initiated Contact with Patient 10/04/16 1718     Chief Complaint  Patient presents with  . Arm Pain   (Consider location/radiation/quality/duration/timing/severity/associated sxs/prior Treatment) 31 year old male presents to the urgent care complaining of a four-day history of pain this started in both shoulders and now primarily in the wrist and forearms. He denies any known trauma. 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 These are repetitive movements. These movements exacerbate the pain. Rest helps with the pain. He states the pain in the shoulders have much improved.      Past Medical History:  Diagnosis Date  . Asthma    Past Surgical History:  Procedure Laterality Date  . MOUTH SURGERY     History reviewed. No pertinent family history. Social History  Substance Use Topics  . Smoking status: Former Games developer  . Smokeless tobacco: Not on file  . Alcohol use Yes    Review of Systems  Constitutional: Negative.   Respiratory: Negative.   Gastrointestinal: Negative.   Genitourinary: Negative.   Musculoskeletal: Positive for myalgias.       As per HPI  Skin: Negative.   Neurological: Negative for dizziness, weakness, numbness and headaches.  All other systems reviewed and are negative.   Allergies  Patient has no known allergies.  Home Medications   Prior to Admission medications   Not on File   Meds Ordered and Administered this Visit  Medications - No data to display  BP 122/84 (BP Location: Left Arm)   Pulse 85   Temp 98.9 F (37.2 C) (Oral)   Resp 16   SpO2 97%  No data found.   Physical Exam  Constitutional: He is oriented to person, place, and time. He appears well-developed and well-nourished. No distress.  Eyes: EOM are normal. Right eye exhibits no discharge. Left eye exhibits no discharge.  Neck: Normal  range of motion. Neck supple.  Pulmonary/Chest: Effort normal. No respiratory distress.  Musculoskeletal: Normal range of motion. He exhibits no edema.  Tenderness bilaterally to the forearm musculature and tendons. Pain to the wrist and forearm with extension and flexion against resistance. Positive percussion tenderness to the forearm and wrist tendons. There is also tenderness to the right medial epicondyles. No swelling, no deformity. Weakness is noted with flexion and extension of the wrist.  Neurological: He is alert and oriented to person, place, and time. No cranial nerve deficit.  Skin: Skin is warm and dry.  Nursing note and vitals reviewed.   Urgent Care Course     Procedures (including critical care time)  Labs Review Labs Reviewed - No data to display  Imaging Review No results found.   Visual Acuity Review  Right Eye Distance:   Left Eye Distance:   Bilateral Distance:    Right Eye Near:   Left Eye Near:    Bilateral Near:         MDM   1. Tendonitis of elbow or forearm   2. Medial epicondylitis of elbow, right   3. Myofasciitis   4. Tendonitis of both wrists    Apply heat to the muscles of the shoulders. Apply ice to the wrist and forearms. Limit activity with grasping and lifting and pulling as much as possible. 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 Wear the forearm or/wrist splint  while at work. May need to wear one on both wrist or alternate. Ibuprofen or Aleve for pain. If you continue to have pain over the next several days and not getting any better call the number for occupational health listed on this page for assistance. I had offered the patient the opportunity to receive an injection of Toradol and he declined. At the time he was discharged he decided that he would receive. Toradol 60 mg IM ordered. 1800 hrs.    Hayden RasmussenMabe, Shondell Fabel, NP 10/04/16 1754    Hayden RasmussenMabe, Tavita Eastham, NP 10/04/16 1758

## 2016-10-04 NOTE — Discharge Instructions (Signed)
Apply heat to the muscles of the shoulders. Apply ice to the wrist and forearms. Limit activity with grasping and lifting and pulling as much as possible. 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 Wear the forearm or/wrist splint while at work. May need to wear one on both wrist or alternate. Ibuprofen or Aleve for pain. If you continue to have pain over the next several days and not getting any better call the number for occupational health listed on this page for assistance.

## 2017-04-07 ENCOUNTER — Emergency Department (HOSPITAL_COMMUNITY): Payer: 59

## 2017-04-07 ENCOUNTER — Encounter (HOSPITAL_COMMUNITY): Payer: Self-pay | Admitting: Emergency Medicine

## 2017-04-07 DIAGNOSIS — Z5321 Procedure and treatment not carried out due to patient leaving prior to being seen by health care provider: Secondary | ICD-10-CM | POA: Diagnosis not present

## 2017-04-07 DIAGNOSIS — R079 Chest pain, unspecified: Secondary | ICD-10-CM | POA: Diagnosis not present

## 2017-04-07 LAB — BASIC METABOLIC PANEL
ANION GAP: 10 (ref 5–15)
BUN: 14 mg/dL (ref 6–20)
CO2: 22 mmol/L (ref 22–32)
Calcium: 9.1 mg/dL (ref 8.9–10.3)
Chloride: 105 mmol/L (ref 101–111)
Creatinine, Ser: 0.94 mg/dL (ref 0.61–1.24)
GFR calc Af Amer: >60 mL/min (ref >60)
GLUCOSE: 115 mg/dL — AB (ref 65–99)
Potassium: 3.6 mmol/L (ref 3.5–5.1)
Sodium: 137 mmol/L (ref 135–145)

## 2017-04-07 LAB — CBC
HEMATOCRIT: 42.5 % (ref 39.0–52.0)
HEMOGLOBIN: 14.8 g/dL (ref 13.0–17.0)
MCH: 29.8 pg (ref 26.0–34.0)
MCHC: 34.8 g/dL (ref 30.0–36.0)
MCV: 85.5 fL (ref 78.0–100.0)
Platelets: 207 10*3/uL (ref 150–400)
RBC: 4.97 MIL/uL (ref 4.22–5.81)
RDW: 13.3 % (ref 11.5–15.5)
WBC: 8.6 10*3/uL (ref 4.0–10.5)

## 2017-04-07 LAB — I-STAT TROPONIN, ED: Troponin i, poc: 0.01 ng/mL (ref 0.00–0.08)

## 2017-04-07 NOTE — ED Triage Notes (Signed)
Patient reports intermittent left chest "discomfort , doesn't feel right" onset this evening with mild SOB , no nausea or diaphoresis .

## 2017-04-08 ENCOUNTER — Emergency Department (HOSPITAL_COMMUNITY)
Admission: EM | Admit: 2017-04-08 | Discharge: 2017-04-08 | Disposition: A | Discharge status: Home / Self Care | Payer: 59 | Attending: Emergency Medicine | Admitting: Emergency Medicine

## 2017-04-08 NOTE — ED Notes (Signed)
Pt not in lobby called multiple times. 

## 2017-04-08 NOTE — ED Notes (Signed)
Pt called for vitals no reply x2.

## 2018-05-22 DIAGNOSIS — R0683 Snoring: Secondary | ICD-10-CM | POA: Diagnosis not present

## 2018-05-22 DIAGNOSIS — R0681 Apnea, not elsewhere classified: Secondary | ICD-10-CM | POA: Diagnosis not present

## 2022-02-26 DIAGNOSIS — Z419 Encounter for procedure for purposes other than remedying health state, unspecified: Secondary | ICD-10-CM | POA: Diagnosis not present

## 2022-03-29 DIAGNOSIS — Z419 Encounter for procedure for purposes other than remedying health state, unspecified: Secondary | ICD-10-CM | POA: Diagnosis not present

## 2022-04-01 ENCOUNTER — Telehealth: Payer: Self-pay

## 2022-04-01 NOTE — Telephone Encounter (Signed)
Mychart msg sent. AS, CMA 

## 2022-04-29 DIAGNOSIS — Z419 Encounter for procedure for purposes other than remedying health state, unspecified: Secondary | ICD-10-CM | POA: Diagnosis not present

## 2022-05-04 NOTE — Progress Notes (Signed)
  Subjective:    Alan Murphy - 37 y.o. male MRN [id number]  Date of birth: April 08, 1985  HPI  Alan Murphy is to establish care.   Current issues and/or concerns: None. Patient plans to return at later date for his annual physical exam.  ROS per HPI    Health Maintenance:  Health Maintenance Due  Topic Date Due   COVID-19 Vaccine (1) Never done   HIV Screening  Never done   Hepatitis C Screening  Never done    Past Medical History: Patient Active Problem List   Diagnosis Date Noted   Encounter for pre-employment health screening examination 09/27/2014   Asthma 05/12/1991    Social History   reports that he has been smoking cigarettes. He has been exposed to tobacco smoke. He has never used smokeless tobacco. He reports current alcohol use of about 3.0 standard drinks of alcohol per week. He reports that he does not currently use drugs after having used the following drugs: Marijuana.   Family History  family history includes Heart disease in his father; Hypertension in his mother.   Medications: reviewed and updated   Objective:   Physical Exam BP 124/79 (BP Location: Left Arm, Patient Position: Sitting, Cuff Size: Large)   Pulse 71   Temp 98.6 F (37 C)   Resp 16   Ht 5' 7.32" (1.71 m)   Wt 200 lb (90.7 kg)   SpO2 98%   BMI 31.02 kg/m   Physical Exam HENT:     Head: Normocephalic and atraumatic.  Eyes:     Extraocular Movements: Extraocular movements intact.     Conjunctiva/sclera: Conjunctivae normal.     Pupils: Pupils are equal, round, and reactive to light.  Cardiovascular:     Rate and Rhythm: Normal rate and regular rhythm.     Pulses: Normal pulses.     Heart sounds: Normal heart sounds.  Pulmonary:     Effort: Pulmonary effort is normal.     Breath sounds: Normal breath sounds.  Musculoskeletal:     Cervical back: Normal range of motion and neck supple.  Neurological:     General: No focal deficit present.     Mental Status: He is  alert and oriented to person, place, and time.  Psychiatric:        Mood and Affect: Mood normal.        Behavior: Behavior normal.       Assessment & Plan:  1. Encounter to establish care - Patient presents today to establish care.  - Return for annual physical examination, labs, and health maintenance. Arrive fasting meaning having no food for at least 8 hours prior to appointment. You may have only water or black coffee. Please take scheduled medications as normal.    Patient was given clear instructions to go to Emergency Department or return to medical center if symptoms don't improve, worsen, or new problems develop.The patient verbalized understanding.  I discussed the assessment and treatment plan with the patient. The patient was provided an opportunity to ask questions and all were answered. The patient agreed with the plan and demonstrated an understanding of the instructions.   The patient was advised to call back or seek an in-person evaluation if the symptoms worsen or if the condition fails to improve as anticipated.    Durene Fruits, NP 05/11/2022, 8:19 AM Primary Care at [facility]

## 2022-05-11 ENCOUNTER — Encounter: Payer: Self-pay | Admitting: Family

## 2022-05-11 ENCOUNTER — Ambulatory Visit (INDEPENDENT_AMBULATORY_CARE_PROVIDER_SITE_OTHER): Payer: Commercial Managed Care - HMO | Admitting: Family

## 2022-05-11 VITALS — BP 124/79 | HR 71 | Temp 98.6°F | Resp 16 | Ht 67.32 in | Wt 200.0 lb

## 2022-05-11 DIAGNOSIS — Z0000 Encounter for general adult medical examination without abnormal findings: Secondary | ICD-10-CM

## 2022-05-11 DIAGNOSIS — Z7689 Persons encountering health services in other specified circumstances: Secondary | ICD-10-CM

## 2022-05-11 NOTE — Progress Notes (Signed)
.  Pt presents to establish care,  -states has not seen physician in about 15 years

## 2022-05-11 NOTE — Patient Instructions (Signed)
Thank you for choosing Primary Care at [facility] for your medical home!    Alan Murphy was seen by Camillia Herter, NP today.   Alan Murphy's primary care provider is Durene Fruits, NP.   For the best care possible,  you should try to see Durene Fruits, NP whenever you come to office.   We look forward to seeing you again soon!  If you have any questions about your visit today,  please call us at [phone]  Or feel free to reach your provider via [employer].   Keeping you healthy   Get these tests Blood pressure- Have your blood pressure checked once a year by your healthcare provider.  Normal blood pressure is 120/80. Weight- Have your body mass index (BMI) calculated to screen for obesity.  BMI is a measure of body fat based on height and weight. You can also calculate your own BMI at [employer].it. Cholesterol- Have your cholesterol checked regularly starting at age 72, sooner may be necessary if you have diabetes, high blood pressure, if a family member developed heart diseases at an early age or if you smoke.  Chlamydia, HIV, and other sexual transmitted disease- Get screened each year until the age of 45 then within three months of each new sexual partner. Diabetes- Have your blood sugar checked regularly if you have high blood pressure, high cholesterol, a family history of diabetes or if you are overweight.   Get these vaccines Flu shot- Every fall. Tetanus shot- Every 10 years. Menactra- Single dose; prevents meningitis.   Take these steps Don't smoke- If you do smoke, ask your healthcare provider about quitting. For tips on how to quit, go to www.smokefree.gov or call 1-800-QUIT-NOW. Be physically active- Exercise 5 days a week for at least 30 minutes.  If you are not already physically active start slow and gradually work up to 30 minutes of moderate physical activity.  Examples of moderate activity include walking briskly, mowing the yard, dancing,  swimming bicycling, etc. Eat a healthy diet- Eat a variety of healthy foods such as fruits, vegetables, low fat milk, low fat cheese, yogurt, lean meats, poultry, fish, beans, tofu, etc.  For more information on healthy eating, go to www.thenutritionsource.org Drink alcohol in moderation- Limit alcohol intake two drinks or less a day.  Never drink and drive. Dentist- Brush and floss teeth twice daily; visit your dentis twice a year. Depression-Your emotional health is as important as your physical health.  If you're feeling down, losing interest in things you normally enjoy please talk with your healthcare provider. Gun Safety- If you keep a gun in your home, keep it unloaded and with the safety lock on.  Bullets should be stored separately. Helmet use- Always wear a helmet when riding a motorcycle, bicycle, rollerblading or skateboarding. Safe sex- If you may be exposed to a sexually transmitted infection, use a condom Seat belts- Seat bels can save your life; always wear one. Smoke/Carbon Monoxide detectors- These detectors need to be installed on the appropriate level of your home.  Replace batteries at least once a year. Skin Cancer- When out in the sun, cover up and use sunscreen SPF 15 or higher. Violence- If anyone is threatening or hurting you, please tell your healthcare provider.

## 2022-05-28 DIAGNOSIS — Z419 Encounter for procedure for purposes other than remedying health state, unspecified: Secondary | ICD-10-CM | POA: Diagnosis not present

## 2022-06-07 ENCOUNTER — Encounter: Payer: Commercial Managed Care - HMO | Admitting: Family Medicine

## 2022-06-08 ENCOUNTER — Encounter: Payer: Commercial Managed Care - HMO | Admitting: Family

## 2022-06-28 DIAGNOSIS — Z419 Encounter for procedure for purposes other than remedying health state, unspecified: Secondary | ICD-10-CM | POA: Diagnosis not present

## 2022-06-30 NOTE — Progress Notes (Signed)
Erroneous encounter-disregard

## 2022-07-07 ENCOUNTER — Encounter: Payer: Commercial Managed Care - HMO | Admitting: Family

## 2022-07-07 DIAGNOSIS — Z1329 Encounter for screening for other suspected endocrine disorder: Secondary | ICD-10-CM

## 2022-07-07 DIAGNOSIS — Z13228 Encounter for screening for other metabolic disorders: Secondary | ICD-10-CM

## 2022-07-07 DIAGNOSIS — Z130 Encounter for screening for diseases of the blood and blood-forming organs and certain disorders involving the immune mechanism: Secondary | ICD-10-CM

## 2022-07-07 DIAGNOSIS — Z131 Encounter for screening for diabetes mellitus: Secondary | ICD-10-CM

## 2022-07-07 DIAGNOSIS — Z13220 Encounter for screening for lipoid disorders: Secondary | ICD-10-CM

## 2022-07-07 DIAGNOSIS — Z0000 Encounter for general adult medical examination without abnormal findings: Secondary | ICD-10-CM

## 2022-07-07 DIAGNOSIS — Z1159 Encounter for screening for other viral diseases: Secondary | ICD-10-CM

## 2022-07-07 DIAGNOSIS — Z114 Encounter for screening for human immunodeficiency virus [HIV]: Secondary | ICD-10-CM

## 2022-11-16 ENCOUNTER — Emergency Department (HOSPITAL_COMMUNITY): Payer: Worker's Compensation

## 2022-11-16 ENCOUNTER — Encounter (HOSPITAL_COMMUNITY): Payer: Self-pay

## 2022-11-16 ENCOUNTER — Emergency Department (HOSPITAL_COMMUNITY)
Admission: EM | Admit: 2022-11-16 | Discharge: 2022-11-16 | Disposition: A | Discharge status: Home / Self Care | Payer: Worker's Compensation | Attending: Emergency Medicine | Admitting: Emergency Medicine

## 2022-11-16 DIAGNOSIS — M542 Cervicalgia: Secondary | ICD-10-CM | POA: Diagnosis present

## 2022-11-16 DIAGNOSIS — Y92410 Unspecified street and highway as the place of occurrence of the external cause: Secondary | ICD-10-CM | POA: Diagnosis not present

## 2022-11-16 DIAGNOSIS — S161XXA Strain of muscle, fascia and tendon at neck level, initial encounter: Secondary | ICD-10-CM | POA: Diagnosis not present

## 2022-11-16 DIAGNOSIS — J45909 Unspecified asthma, uncomplicated: Secondary | ICD-10-CM | POA: Diagnosis not present
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
## 2022-11-16 NOTE — ED Notes (Signed)
Pt verbalized understanding of discharge instructions. Opportunity for questions provided.  

## 2022-11-16 NOTE — ED Notes (Signed)
Patient transported to X-ray 
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
## 2023-03-16 ENCOUNTER — Telehealth: Payer: Medicaid Other | Admitting: Physician Assistant

## 2023-03-16 DIAGNOSIS — Z2089 Contact with and (suspected) exposure to other communicable diseases: Secondary | ICD-10-CM | POA: Diagnosis not present

## 2023-03-16 DIAGNOSIS — J4521 Mild intermittent asthma with (acute) exacerbation: Secondary | ICD-10-CM

## 2023-03-16 MED ORDER — ALBUTEROL SULFATE (2.5 MG/3ML) 0.083% IN NEBU: 2.5000 mg | INHALATION_SOLUTION | Freq: Four times a day (QID) | RESPIRATORY_TRACT | 1 refills | Status: AC | PRN

## 2023-03-16 MED ORDER — AZITHROMYCIN 250 MG PO TABS: ORAL_TABLET | ORAL | 0 refills | Status: AC

## 2023-03-16 MED ORDER — ALBUTEROL SULFATE HFA 108 (90 BASE) MCG/ACT IN AERS: 1.0000 | INHALATION_SPRAY | Freq: Four times a day (QID) | RESPIRATORY_TRACT | 0 refills | Status: AC | PRN

## 2023-03-16 MED ORDER — BENZONATATE 100 MG PO CAPS: 100.0000 mg | ORAL_CAPSULE | Freq: Three times a day (TID) | ORAL | 0 refills | Status: DC | PRN

## 2023-03-16 MED ORDER — PREDNISONE 20 MG PO TABS: 40.0000 mg | ORAL_TABLET | Freq: Every day | ORAL | 0 refills | Status: DC

## 2023-03-16 NOTE — Progress Notes (Signed)
Virtual Visit Consent   [name], you are scheduled for a virtual visit with a [facility] provider today. Just as with appointments in the office, your consent must be obtained to participate. Your consent will be active for this visit and any virtual visit you may have with one of our providers in the next 365 days. If you have a MyChart account, a copy of this consent can be sent to you electronically.  As this is a virtual visit, video technology does not allow for your provider to perform a traditional examination. This may limit your provider's ability to fully assess your condition. If your provider identifies any concerns that need to be evaluated in person or the need to arrange testing (such as labs, EKG, etc.), we will make arrangements to do so. Although advances in technology are sophisticated, we cannot ensure that it will always work on either your end or our end. If the connection with a video visit is poor, the visit may have to be switched to a telephone visit. With either a video or telephone visit, we are not always able to ensure that we have a secure connection.  By engaging in this virtual visit, you consent to the provision of healthcare and authorize for your insurance to be billed (if applicable) for the services provided during this visit. Depending on your insurance coverage, you may receive a charge related to this service.  I need to obtain your verbal consent now. Are you willing to proceed with your visit today? Alan Murphy has provided verbal consent on 03/16/2023 for a virtual visit (video or telephone). Piedad Climes, [place]  Date: 03/16/2023 7:29 PM  Virtual Visit via Video Note   I, Piedad Climes, connected with  Alan Murphy  ([id number], [birth year]) on 03/16/23 at  7:30 PM EST by a video-enabled telemedicine application and verified that I am speaking with the correct person using two identifiers.  Location: Patient: Virtual Visit  Location Patient: Home Provider: Virtual Visit Location Provider: Home Office   I discussed the limitations of evaluation and management by telemedicine and the availability of in person appointments. The patient expressed understanding and agreed to proceed.    History of Present Illness: Alan Murphy is a 37 y.o. who identifies as a male who was assigned male at birth, and is being seen today for 2-3 days of URI symptoms starting with a tickle in the throat, progressing to chest tightness and substantial wheezing. Noting SOBOE. Since then noting cough that is productive of phlegm. Daughter is currently recovering from a walking pneumonia after testing negative for COVID, RSV and influenza.   HPI: HPI  Problems:  Patient Active Problem List   Diagnosis Date Noted   Encounter for pre-employment health screening examination 09/27/2014   Asthma 05/12/1991    Allergies: No Known Allergies Medications:  Current Outpatient Medications:    albuterol (PROVENTIL) (2.5 MG/3ML) 0.083% nebulizer solution, Take 3 mLs (2.5 mg total) by nebulization every 6 (six) hours as needed for wheezing or shortness of breath., Disp: 150 mL, Rfl: 1   azithromycin (ZITHROMAX) 250 MG tablet, Take 2 tablets on day 1, then 1 tablet daily on days 2 through 5, Disp: 6 tablet, Rfl: 0   benzonatate (TESSALON) 100 MG capsule, Take 1 capsule (100 mg total) by mouth 3 (three) times daily as needed for cough., Disp: 30 capsule, Rfl: 0   predniSONE (DELTASONE) 20 MG tablet, Take 2 tablets (40 mg total) by mouth  daily with breakfast., Disp: 10 tablet, Rfl: 0   albuterol (VENTOLIN HFA) 108 (90 Base) MCG/ACT inhaler, Inhale 1-2 puffs into the lungs every 6 (six) hours as needed for shortness of breath or wheezing., Disp: 8 g, Rfl: 0  Observations/Objective: Patient is well-developed, well-nourished in no acute distress.  Resting comfortably at home.  Head is normocephalic, atraumatic.  No labored breathing. Speech is clear  and coherent with logical content.  Patient is alert and oriented at baseline.   Assessment and Plan: 1. Exposure to pneumonia (Primary) - azithromycin (ZITHROMAX) 250 MG tablet; Take 2 tablets on day 1, then 1 tablet daily on days 2 through 5  Dispense: 6 tablet; Refill: 0  2. Mild intermittent asthma with exacerbation - predniSONE (DELTASONE) 20 MG tablet; Take 2 tablets (40 mg total) by mouth daily with breakfast.  Dispense: 10 tablet; Refill: 0 - benzonatate (TESSALON) 100 MG capsule; Take 1 capsule (100 mg total) by mouth 3 (three) times daily as needed for cough.  Dispense: 30 capsule; Refill: 0 - albuterol (PROVENTIL) (2.5 MG/3ML) 0.083% nebulizer solution; Take 3 mLs (2.5 mg total) by nebulization every 6 (six) hours as needed for wheezing or shortness of breath.  Dispense: 150 mL; Refill: 1 - albuterol (VENTOLIN HFA) 108 (90 Base) MCG/ACT inhaler; Inhale 1-2 puffs into the lungs every 6 (six) hours as needed for shortness of breath or wheezing.  Dispense: 8 g; Refill: 0  Right now seems more likely viral URI with asthma exacerbation. Supportive measures and OTC medications reviewed. Start Prednisone burst, Tessalon per orders. Refilled albuterol HFA and nebulizer solution. Giving close contact with walking pneumonia, will add on Azithromycin to start if noting progressive worsening of cough/congestion and/or new onset of fever. Strict ER precautions reviewed with patient.   Follow Up Instructions: I discussed the assessment and treatment plan with the patient. The patient was provided an opportunity to ask questions and all were answered. The patient agreed with the plan and demonstrated an understanding of the instructions.  A copy of instructions were sent to the patient via MyChart unless otherwise noted below.   The patient was advised to call back or seek an in-person evaluation if the symptoms worsen or if the condition fails to improve as anticipated.    Piedad Climes,  PA-C

## 2023-03-16 NOTE — Patient Instructions (Signed)
Alan Murphy, thank you for joining Piedad Climes, PA-C for today's virtual visit.  While this provider is not your primary care provider (PCP), if your PCP is located in our provider database this encounter information will be shared with them immediately following your visit.   A [facility] MyChart account gives you access to today's visit and all your visits, tests, and labs performed at [facility] " click here if you don't have a [facility] MyChart account or go to [email or web address]  Consent: (Patient) Alan Murphy provided verbal consent for this virtual visit at the beginning of the encounter.  Current Medications:  Current Outpatient Medications:    albuterol (PROVENTIL) (2.5 MG/3ML) 0.083% nebulizer solution, Take 3 mLs (2.5 mg total) by nebulization every 6 (six) hours as needed for wheezing or shortness of breath., Disp: 150 mL, Rfl: 1   azithromycin (ZITHROMAX) 250 MG tablet, Take 2 tablets on day 1, then 1 tablet daily on days 2 through 5, Disp: 6 tablet, Rfl: 0   benzonatate (TESSALON) 100 MG capsule, Take 1 capsule (100 mg total) by mouth 3 (three) times daily as needed for cough., Disp: 30 capsule, Rfl: 0   predniSONE (DELTASONE) 20 MG tablet, Take 2 tablets (40 mg total) by mouth daily with breakfast., Disp: 10 tablet, Rfl: 0   albuterol (VENTOLIN HFA) 108 (90 Base) MCG/ACT inhaler, Inhale 1-2 puffs into the lungs every 6 (six) hours as needed for shortness of breath or wheezing., Disp: 8 g, Rfl: 0   Medications ordered in this encounter:  Meds ordered this encounter  Medications   predniSONE (DELTASONE) 20 MG tablet    Sig: Take 2 tablets (40 mg total) by mouth daily with breakfast.    Dispense:  10 tablet    Refill:  0    Supervising Provider:   Merrilee Jansky [[id number]]   benzonatate (TESSALON) 100 MG capsule    Sig: Take 1 capsule (100 mg total) by mouth 3 (three) times daily as needed for cough.    Dispense:  30 capsule     Refill:  0    Supervising Provider:   Merrilee Jansky [[id number]]   albuterol (PROVENTIL) (2.5 MG/3ML) 0.083% nebulizer solution    Sig: Take 3 mLs (2.5 mg total) by nebulization every 6 (six) hours as needed for wheezing or shortness of breath.    Dispense:  150 mL    Refill:  1    Supervising Provider:   Merrilee Jansky [[id number]]   albuterol (VENTOLIN HFA) 108 (90 Base) MCG/ACT inhaler    Sig: Inhale 1-2 puffs into the lungs every 6 (six) hours as needed for shortness of breath or wheezing.    Dispense:  8 g    Refill:  0    Supervising Provider:   Merrilee Jansky [[id number]]   azithromycin (ZITHROMAX) 250 MG tablet    Sig: Take 2 tablets on day 1, then 1 tablet daily on days 2 through 5    Dispense:  6 tablet    Refill:  0    Supervising Provider:   Merrilee Jansky [phone]     *If you need refills on other medications prior to your next appointment, please contact your pharmacy*  Follow-Up: Call back or seek an in-person evaluation if the symptoms worsen or if the condition fails to improve as anticipated.  [facility] [phone]  Other Instructions Please keep hydrated and rest. Start Mucinex OTC as discussed. I have refilled your  albuterol inhaler and nebulizer solution -- use as directed. The Tessalon is for cough and the Prednisone is to calm your asthma flare. If breathing improving but congestion continues to progress, and you note change in color of phlegm and/or fever, I want you to take the Azithromycin I have sent in as directed. Any worsening of breathing despite treatment, you need an in person evaluation ASAP.   If you have been instructed to have an in-person evaluation today at a local Urgent Care facility, please use the link below. It will take you to a list of all of our available [facility], including address, phone number and hours of operation. Please do not delay care.  [facility]  If you or a family  member do not have a primary care provider, use the link below to schedule a visit and establish care. When you choose a [facility] primary care physician or advanced practice provider, you gain a long-term partner in health. Find a Primary Care Provider  Learn more about [facility] in-office and virtual care options:  - Get Care Now

## 2023-03-17 ENCOUNTER — Ambulatory Visit: Payer: Medicaid Other

## 2023-04-29 ENCOUNTER — Ambulatory Visit (INDEPENDENT_AMBULATORY_CARE_PROVIDER_SITE_OTHER): Payer: Medicaid Other | Admitting: Family

## 2023-04-29 ENCOUNTER — Encounter: Payer: Self-pay | Admitting: Family

## 2023-04-29 VITALS — BP 143/89 | HR 85 | Temp 97.6°F | Ht 67.0 in | Wt 213.0 lb

## 2023-04-29 DIAGNOSIS — Z1159 Encounter for screening for other viral diseases: Secondary | ICD-10-CM

## 2023-04-29 DIAGNOSIS — Z130 Encounter for screening for diseases of the blood and blood-forming organs and certain disorders involving the immune mechanism: Secondary | ICD-10-CM

## 2023-04-29 DIAGNOSIS — Z13228 Encounter for screening for other metabolic disorders: Secondary | ICD-10-CM

## 2023-04-29 DIAGNOSIS — Z794 Long term (current) use of insulin: Secondary | ICD-10-CM

## 2023-04-29 DIAGNOSIS — Z1329 Encounter for screening for other suspected endocrine disorder: Secondary | ICD-10-CM

## 2023-04-29 DIAGNOSIS — Z0000 Encounter for general adult medical examination without abnormal findings: Secondary | ICD-10-CM | POA: Diagnosis not present

## 2023-04-29 DIAGNOSIS — Z13220 Encounter for screening for lipoid disorders: Secondary | ICD-10-CM | POA: Diagnosis not present

## 2023-04-29 DIAGNOSIS — Z0130 Encounter for examination of blood pressure without abnormal findings: Secondary | ICD-10-CM

## 2023-04-29 DIAGNOSIS — Z114 Encounter for screening for human immunodeficiency virus [HIV]: Secondary | ICD-10-CM

## 2023-04-29 DIAGNOSIS — Z131 Encounter for screening for diabetes mellitus: Secondary | ICD-10-CM

## 2023-04-29 NOTE — Progress Notes (Signed)
Patient states a list of lab results from blood test he had done recently, wants you to look at it to give him guidance.

## 2023-04-29 NOTE — Progress Notes (Signed)
Patient ID: RIC ROSENBERG, male    DOB: 1985/07/12  MRN: [id number]  CC: Annual Exam  Subjective: Alan Murphy is a 38 y.o. male who presents for annual exam.   His concerns today include:  - States 1 month ago he was seen at a walk-in clinic where cholesterol, kidneys, and diabetes screening was collected and returned abnormal. States he was not fasting prior to lab collection. States walk-in clinic called him with lab results and explained to him but he didn't understand. Today I discussed labs explanation and next steps to repeat labs today. Patient verbalized understanding/agreement.  Patient Active Problem List   Diagnosis Date Noted   Encounter for pre-employment health screening examination 09/27/2014   Asthma 05/12/1991     Current Outpatient Medications on File Prior to Visit  Medication Sig Dispense Refill   albuterol (PROVENTIL) (2.5 MG/3ML) 0.083% nebulizer solution Take 3 mLs (2.5 mg total) by nebulization every 6 (six) hours as needed for wheezing or shortness of breath. 150 mL 1   albuterol (VENTOLIN HFA) 108 (90 Base) MCG/ACT inhaler Inhale 1-2 puffs into the lungs every 6 (six) hours as needed for shortness of breath or wheezing. 8 g 0   benzonatate (TESSALON) 100 MG capsule Take 1 capsule (100 mg total) by mouth 3 (three) times daily as needed for cough. (Patient not taking: Reported on 04/29/2023) 30 capsule 0   predniSONE (DELTASONE) 20 MG tablet Take 2 tablets (40 mg total) by mouth daily with breakfast. (Patient not taking: Reported on 04/29/2023) 10 tablet 0   No current facility-administered medications on file prior to visit.    Allergies  Allergen Reactions   Cat Dander Itching and Other (See Comments)    Pet Dander    Social History   Socioeconomic History   Marital status: Single    Spouse name: Not on file   Number of children: Not on file   Years of education: Not on file   Highest education level: Not on file  Occupational History   Not  on file  Tobacco Use   Smoking status: Every Day    Types: Cigarettes    Passive exposure: Current   Smokeless tobacco: Never  Vaping Use   Vaping status: Never Used  Substance and Sexual Activity   Alcohol use: Yes    Alcohol/week: 3.0 standard drinks of alcohol    Types: 3 Standard drinks or equivalent per week    Comment: occ   Drug use: Yes    Types: Marijuana    Comment: occ   Sexual activity: Not on file  Other Topics Concern   Not on file  Social History Narrative   Not on file   Social Drivers of Health   Financial Resource Strain: Medium Risk (04/29/2023)   Overall Financial Resource Strain (CARDIA)    Difficulty of Paying Living Expenses: Somewhat hard  Food Insecurity: Not on file  Transportation Needs: Not on file  Physical Activity: Sufficiently Active (04/29/2023)   Exercise Vital Sign    Days of Exercise per Week: 5 days    Minutes of Exercise per Session: 150+ min  Stress: No Stress Concern Present (04/29/2023)   [employer] of Occupational Health - Occupational Stress Questionnaire    Feeling of Stress : Not at all  Social Connections: Moderately Isolated (04/29/2023)   Social Connection and Isolation Panel [NHANES]    Frequency of Communication with Friends and Family: More than three times a week    Frequency of Social  Gatherings with Friends and Family: Once a week    Attends Religious Services: Never    Database administrator or Organizations: No    Attends Engineer, structural: Never    Marital Status: Married  Catering manager Violence: Not on file    Family History  Problem Relation Age of Onset   Hypertension Mother    Heart disease Father     Past Surgical History:  Procedure Laterality Date   MOUTH SURGERY      ROS: Review of Systems Negative except as stated above  PHYSICAL EXAM: BP (!) 143/89   Pulse 85   Temp 97.6 F (36.4 C) (Oral)   Ht 5\' 7"  (1.702 m)   Wt 213 lb (96.6 kg)   SpO2 94%   BMI 33.36 kg/m    Physical Exam HENT:     Head: Normocephalic and atraumatic.     Right Ear: Tympanic membrane, ear canal and external ear normal.     Left Ear: Tympanic membrane, ear canal and external ear normal.     Nose: Nose normal.     Mouth/Throat:     Mouth: Mucous membranes are moist.     Pharynx: Oropharynx is clear.  Eyes:     Extraocular Movements: Extraocular movements intact.     Conjunctiva/sclera: Conjunctivae normal.     Pupils: Pupils are equal, round, and reactive to light.  Neck:     Thyroid: No thyroid mass, thyromegaly or thyroid tenderness.  Cardiovascular:     Rate and Rhythm: Normal rate and regular rhythm.     Pulses: Normal pulses.     Heart sounds: Normal heart sounds.  Pulmonary:     Effort: Pulmonary effort is normal.     Breath sounds: Normal breath sounds.  Abdominal:     General: Bowel sounds are normal.     Palpations: Abdomen is soft.  Genitourinary:    Comments: Patient declined.  Musculoskeletal:        General: Normal range of motion.     Right shoulder: Normal.     Left shoulder: Normal.     Right upper arm: Normal.     Left upper arm: Normal.     Right elbow: Normal.     Left elbow: Normal.     Right forearm: Normal.     Left forearm: Normal.     Right wrist: Normal.     Left wrist: Normal.     Right hand: Normal.     Left hand: Normal.     Cervical back: Normal, normal range of motion and neck supple.     Thoracic back: Normal.     Lumbar back: Normal.     Right hip: Normal.     Left hip: Normal.     Right upper leg: Normal.     Left upper leg: Normal.     Right knee: Normal.     Left knee: Normal.     Right lower leg: Normal.     Left lower leg: Normal.     Right ankle: Normal.     Left ankle: Normal.     Right foot: Normal.     Left foot: Normal.  Skin:    General: Skin is warm and dry.     Capillary Refill: Capillary refill takes less than 2 seconds.  Neurological:     General: No focal deficit present.     Mental Status: He  is alert and oriented to person, place, and time.  Psychiatric:        Mood and Affect: Mood normal.        Behavior: Behavior normal.    ASSESSMENT AND PLAN: 1. Annual physical exam (Primary) - Counseled on 150 minutes of exercise per week as tolerated, healthy eating (including decreased daily intake of saturated fats, cholesterol, added sugars, sodium), STI prevention, and routine healthcare maintenance.  2. Screening for metabolic disorder - Routine screening.  - CMP14+EGFR  3. Screening for deficiency anemia - Routine screening.  - CBC  4. Diabetes mellitus screening - Routine screening.  - Hemoglobin A1c  5. Screening cholesterol level - Routine screening.  - Lipid panel  6. Thyroid disorder screen - Routine screening.  - TSH  7. Encounter for screening for HIV - Routine screening.  - HIV antibody (with reflex)  8. Need for hepatitis C screening test - Routine screening.  - Hepatitis C Antibody  9. Blood pressure check - Blood pressure not at goal during today's visit. Patient asymptomatic without chest pressure, chest pain, palpitations, shortness of breath, worst headache of life, and any additional red flag symptoms. - Follow-up in 4 weeks or sooner if needed for blood pressure check.    Patient was given the opportunity to ask questions.  Patient verbalized understanding of the plan and was able to repeat key elements of the plan. Patient was given clear instructions to go to Emergency Department or return to medical center if symptoms don't improve, worsen, or new problems develop.The patient verbalized understanding.   Orders Placed This Encounter  Procedures   HIV antibody (with reflex)   Hepatitis C Antibody   CBC   Lipid panel   CMP14+EGFR   Hemoglobin A1c   TSH     Return in about 1 year (around 04/28/2024) for Physical per patient preference and 4 weeks blood pressure check .  Rema Fendt, NP

## 2023-04-30 LAB — CBC
Hematocrit: 49 % (ref 37.5–51.0)
Hemoglobin: 16.3 g/dL (ref 13.0–17.7)
MCH: 29.5 pg (ref 26.6–33.0)
MCHC: 33.3 g/dL (ref 31.5–35.7)
MCV: 89 fL (ref 79–97)
Platelets: 214 10*3/uL (ref 150–450)
RBC: 5.52 x10E6/uL (ref 4.14–5.80)
RDW: 13.7 % (ref 11.6–15.4)
WBC: 6.7 10*3/uL (ref 3.4–10.8)

## 2023-04-30 LAB — CMP14+EGFR
ALT: 34 [IU]/L (ref 0–44)
AST: 31 [IU]/L (ref 0–40)
Albumin: 4.5 g/dL (ref 4.1–5.1)
Alkaline Phosphatase: 115 [IU]/L (ref 44–121)
BUN/Creatinine Ratio: 12 (ref 9–20)
BUN: 11 mg/dL (ref 6–20)
Bilirubin Total: 0.8 mg/dL (ref 0.0–1.2)
CO2: 20 mmol/L (ref 20–29)
Calcium: 9.4 mg/dL (ref 8.7–10.2)
Chloride: 103 mmol/L (ref 96–106)
Creatinine, Ser: 0.94 mg/dL (ref 0.76–1.27)
Globulin, Total: 2.9 g/dL (ref 1.5–4.5)
Glucose: 105 mg/dL — ABNORMAL HIGH (ref 70–99)
Potassium: 4.5 mmol/L (ref 3.5–5.2)
Sodium: 140 mmol/L (ref 134–144)
Total Protein: 7.4 g/dL (ref 6.0–8.5)
eGFR: 108 mL/min/{1.73_m2} (ref >59)

## 2023-04-30 LAB — HEMOGLOBIN A1C
Est. average glucose Bld gHb Est-mCnc: 120 mg/dL
Hgb A1c MFr Bld: 5.8 % — ABNORMAL HIGH (ref 4.8–5.6)

## 2023-04-30 LAB — HEPATITIS C ANTIBODY: Hep C Virus Ab: NONREACTIVE

## 2023-04-30 LAB — LIPID PANEL
Chol/HDL Ratio: 6.6 {ratio} — ABNORMAL HIGH (ref 0.0–5.0)
Cholesterol, Total: 192 mg/dL (ref 100–199)
HDL: 29 mg/dL — ABNORMAL LOW (ref >39)
LDL Chol Calc (NIH): 122 mg/dL — ABNORMAL HIGH (ref 0–99)
Triglycerides: 231 mg/dL — ABNORMAL HIGH (ref 0–149)
VLDL Cholesterol Cal: 41 mg/dL — ABNORMAL HIGH (ref 5–40)

## 2023-04-30 LAB — HIV ANTIBODY (ROUTINE TESTING W REFLEX): HIV Screen 4th Generation wRfx: NONREACTIVE

## 2023-04-30 LAB — TSH: TSH: 1.36 u[IU]/mL (ref 0.450–4.500)

## 2023-05-02 ENCOUNTER — Other Ambulatory Visit: Payer: Self-pay | Admitting: Family

## 2023-05-02 ENCOUNTER — Encounter: Payer: Self-pay | Admitting: Family

## 2023-05-02 DIAGNOSIS — E785 Hyperlipidemia, unspecified: Secondary | ICD-10-CM

## 2023-05-02 MED ORDER — ATORVASTATIN CALCIUM 20 MG PO TABS: 20.0000 mg | ORAL_TABLET | Freq: Every day | ORAL | 0 refills | Status: DC

## 2023-05-27 ENCOUNTER — Encounter: Payer: Self-pay | Admitting: Family

## 2023-05-27 ENCOUNTER — Ambulatory Visit (INDEPENDENT_AMBULATORY_CARE_PROVIDER_SITE_OTHER): Payer: Medicaid Other | Admitting: Family

## 2023-05-27 VITALS — BP 149/98 | HR 87 | Temp 98.0°F | Ht 68.0 in | Wt 216.4 lb

## 2023-05-27 DIAGNOSIS — E785 Hyperlipidemia, unspecified: Secondary | ICD-10-CM | POA: Diagnosis not present

## 2023-05-27 DIAGNOSIS — Z0130 Encounter for examination of blood pressure without abnormal findings: Secondary | ICD-10-CM | POA: Diagnosis not present

## 2023-05-27 NOTE — Progress Notes (Signed)
 Patient states he is here for follow up.

## 2023-05-27 NOTE — Progress Notes (Signed)
 Patient ID: Alan Murphy, male    DOB: 17-Jul-1985  MRN: [id number]  CC: Chronic Conditions Follow-Up  Subjective: Alan Murphy is a 38 y.o. male who presents for chronic conditions follow-up.   His concerns today include:  - States since previous office visit he did not begin Atorvastatin due to he did not know he was supposed to.  - Blood pressure check. States he is not watching what he eats as much as he should. He does exercise. States he is not ready to begin blood pressure medications as of present. He does not complain of red flag symptoms such as but not limited to chest pain, shortness of breath, worst headache of life, nausea/vomiting.    Patient Active Problem List   Diagnosis Date Noted   Encounter for pre-employment health screening examination 09/27/2014   Asthma 05/12/1991     Current Outpatient Medications on File Prior to Visit  Medication Sig Dispense Refill   albuterol (PROVENTIL) (2.5 MG/3ML) 0.083% nebulizer solution Take 3 mLs (2.5 mg total) by nebulization every 6 (six) hours as needed for wheezing or shortness of breath. 150 mL 1   albuterol (VENTOLIN HFA) 108 (90 Base) MCG/ACT inhaler Inhale 1-2 puffs into the lungs every 6 (six) hours as needed for shortness of breath or wheezing. 8 g 0   atorvastatin (LIPITOR) 20 MG tablet Take 1 tablet (20 mg total) by mouth daily. 90 tablet 0   benzonatate (TESSALON) 100 MG capsule Take 1 capsule (100 mg total) by mouth 3 (three) times daily as needed for cough. (Patient not taking: Reported on 05/27/2023) 30 capsule 0   predniSONE (DELTASONE) 20 MG tablet Take 2 tablets (40 mg total) by mouth daily with breakfast. (Patient not taking: Reported on 05/27/2023) 10 tablet 0   No current facility-administered medications on file prior to visit.    Allergies  Allergen Reactions   Cat Dander Itching and Other (See Comments)    Pet Dander    Social History   Socioeconomic History   Marital status: Single    Spouse  name: Not on file   Number of children: Not on file   Years of education: Not on file   Highest education level: Not on file  Occupational History   Not on file  Tobacco Use   Smoking status: Every Day    Types: Cigarettes    Passive exposure: Current   Smokeless tobacco: Never  Vaping Use   Vaping status: Never Used  Substance and Sexual Activity   Alcohol use: Yes    Alcohol/week: 3.0 standard drinks of alcohol    Types: 3 Standard drinks or equivalent per week    Comment: occ   Drug use: Yes    Types: Marijuana    Comment: occ   Sexual activity: Not on file  Other Topics Concern   Not on file  Social History Narrative   Not on file   Social Drivers of Health   Financial Resource Strain: Medium Risk (04/29/2023)   Overall Financial Resource Strain (CARDIA)    Difficulty of Paying Living Expenses: Somewhat hard  Food Insecurity: Not on file  Transportation Needs: Not on file  Physical Activity: Sufficiently Active (04/29/2023)   Exercise Vital Sign    Days of Exercise per Week: 5 days    Minutes of Exercise per Session: 150+ min  Stress: No Stress Concern Present (04/29/2023)   [employer] of Occupational Health - Occupational Stress Questionnaire    Feeling of Stress :  Not at all  Social Connections: Moderately Isolated (04/29/2023)   Social Connection and Isolation Panel [NHANES]    Frequency of Communication with Friends and Family: More than three times a week    Frequency of Social Gatherings with Friends and Family: Once a week    Attends Religious Services: Never    Database administrator or Organizations: No    Attends Engineer, structural: Never    Marital Status: Married  Catering manager Violence: Not on file    Family History  Problem Relation Age of Onset   Hypertension Mother    Heart disease Father     Past Surgical History:  Procedure Laterality Date   MOUTH SURGERY      ROS: Review of Systems Negative except as stated  above  PHYSICAL EXAM: BP (!) 149/98   Pulse 87   Temp 98 F (36.7 C) (Oral)   Ht 5\' 8"  (1.727 m)   Wt 216 lb 6.4 oz (98.2 kg)   SpO2 96%   BMI 32.90 kg/m   Physical Exam HENT:     Head: Normocephalic and atraumatic.     Nose: Nose normal.     Mouth/Throat:     Mouth: Mucous membranes are moist.     Pharynx: Oropharynx is clear.  Eyes:     Extraocular Movements: Extraocular movements intact.     Conjunctiva/sclera: Conjunctivae normal.     Pupils: Pupils are equal, round, and reactive to light.  Cardiovascular:     Rate and Rhythm: Normal rate and regular rhythm.     Pulses: Normal pulses.     Heart sounds: Normal heart sounds.  Pulmonary:     Effort: Pulmonary effort is normal.     Breath sounds: Normal breath sounds.  Musculoskeletal:        General: Normal range of motion.     Cervical back: Normal range of motion and neck supple.  Neurological:     General: No focal deficit present.     Mental Status: He is alert and oriented to person, place, and time.  Psychiatric:        Mood and Affect: Mood normal.        Behavior: Behavior normal.     ASSESSMENT AND PLAN: 1. Hyperlipidemia, unspecified hyperlipidemia type (Primary) - Patient reports he did not begin Atorvastatin since previous office visit.  - Routine screening.   - Follow-up with primary provider as scheduled. - Lipid panel  2. Blood pressure check - Blood pressure not at goal during today's visit. Patient asymptomatic without chest pressure, chest pain, palpitations, shortness of breath, worst headache of life, and any additional red flag symptoms. - Patient declined pharmacological therapy.  - Follow-up with primary provider as scheduled.   Patient was given the opportunity to ask questions.  Patient verbalized understanding of the plan and was able to repeat key elements of the plan. Patient was given clear instructions to go to Emergency Department or return to medical center if symptoms don't  improve, worsen, or new problems develop.The patient verbalized understanding.   Orders Placed This Encounter  Procedures   Lipid panel    Return in about 2 months (around 07/25/2023) for Follow-Up or next available.  Rema Fendt, NP

## 2023-05-28 LAB — LIPID PANEL
Chol/HDL Ratio: 5.2 ratio — ABNORMAL HIGH (ref 0.0–5.0)
Cholesterol, Total: 187 mg/dL (ref 100–199)
HDL: 36 mg/dL — ABNORMAL LOW (ref >39)
LDL Chol Calc (NIH): 131 mg/dL — ABNORMAL HIGH (ref 0–99)
Triglycerides: 108 mg/dL (ref 0–149)
VLDL Cholesterol Cal: 20 mg/dL (ref 5–40)

## 2023-05-30 ENCOUNTER — Encounter: Payer: Self-pay | Admitting: Family

## 2023-05-30 ENCOUNTER — Other Ambulatory Visit: Payer: Self-pay | Admitting: Family

## 2023-05-30 DIAGNOSIS — E785 Hyperlipidemia, unspecified: Secondary | ICD-10-CM

## 2023-05-30 MED ORDER — ATORVASTATIN CALCIUM 20 MG PO TABS: 20.0000 mg | ORAL_TABLET | Freq: Every day | ORAL | 0 refills | Status: DC

## 2023-07-25 ENCOUNTER — Ambulatory Visit (INDEPENDENT_AMBULATORY_CARE_PROVIDER_SITE_OTHER): Payer: Medicaid Other | Admitting: Family

## 2023-07-25 VITALS — BP 137/99 | HR 73 | Temp 98.1°F | Resp 16 | Ht 67.5 in | Wt 214.8 lb

## 2023-07-25 DIAGNOSIS — E785 Hyperlipidemia, unspecified: Secondary | ICD-10-CM

## 2023-07-25 DIAGNOSIS — I10 Essential (primary) hypertension: Secondary | ICD-10-CM | POA: Diagnosis not present

## 2023-07-25 DIAGNOSIS — Z0100 Encounter for examination of eyes and vision without abnormal findings: Secondary | ICD-10-CM

## 2023-07-25 MED ORDER — AMLODIPINE BESYLATE 5 MG PO TABS: 5.0000 mg | ORAL_TABLET | Freq: Every day | ORAL | 0 refills | Status: AC

## 2023-07-25 NOTE — Progress Notes (Signed)
 Patient ID: Alan Murphy, male    DOB: 04-16-1985  MRN: [id number]  CC: Chronic Conditions Follow-Up  Subjective: Alan Murphy is a 38 y.o. male who presents for chronic conditions follow-up.   His concerns today include:  - States he is not taking Atorvastatin . States he is trying to watch what he eats.  - Home blood pressures above goal. He does not complain of red flag symptoms such as but not limited to chest pain, shortness of breath, worst headache of life, nausea/vomiting.  - Right eye blurry vision. Denies red flag symptoms.  Patient Active Problem List   Diagnosis Date Noted   Encounter for pre-employment health screening examination 09/27/2014   Asthma 05/12/1991     Current Outpatient Medications on File Prior to Visit  Medication Sig Dispense Refill   albuterol  (PROVENTIL ) (2.5 MG/3ML) 0.083% nebulizer solution Take 3 mLs (2.5 mg total) by nebulization every 6 (six) hours as needed for wheezing or shortness of breath. 150 mL 1   albuterol  (VENTOLIN  HFA) 108 (90 Base) MCG/ACT inhaler Inhale 1-2 puffs into the lungs every 6 (six) hours as needed for shortness of breath or wheezing. 8 g 0   atorvastatin  (LIPITOR) 20 MG tablet Take 1 tablet (20 mg total) by mouth daily. (Patient not taking: Reported on 07/25/2023) 90 tablet 0   benzonatate  (TESSALON ) 100 MG capsule Take 1 capsule (100 mg total) by mouth 3 (three) times daily as needed for cough. (Patient not taking: Reported on 05/27/2023) 30 capsule 0   predniSONE  (DELTASONE ) 20 MG tablet Take 2 tablets (40 mg total) by mouth daily with breakfast. (Patient not taking: Reported on 05/27/2023) 10 tablet 0   No current facility-administered medications on file prior to visit.    Allergies  Allergen Reactions   Cat Dander Itching and Other (See Comments)    Pet Dander    Social History   Socioeconomic History   Marital status: Single    Spouse name: Not on file   Number of children: Not on file   Years of  education: Not on file   Highest education level: Some college, no degree  Occupational History   Not on file  Tobacco Use   Smoking status: Every Day    Types: Cigarettes    Passive exposure: Current   Smokeless tobacco: Never  Vaping Use   Vaping status: Never Used  Substance and Sexual Activity   Alcohol use: Yes    Alcohol/week: 3.0 standard drinks of alcohol    Types: 3 Standard drinks or equivalent per week    Comment: occ   Drug use: Yes    Types: Marijuana    Comment: occ   Sexual activity: Not on file  Other Topics Concern   Not on file  Social History Narrative   Not on file   Social Drivers of Health   Financial Resource Strain: Medium Risk (07/25/2023)   Overall Financial Resource Strain (CARDIA)    Difficulty of Paying Living Expenses: Somewhat hard  Food Insecurity: Food Insecurity Present (07/25/2023)   Hunger Vital Sign    Worried About Running Out of Food in the Last Year: Sometimes true    Ran Out of Food in the Last Year: Sometimes true  Transportation Needs: No Transportation Needs (07/25/2023)   PRAPARE - Administrator, Civil Service (Medical): No    Lack of Transportation (Non-Medical): No  Physical Activity: Insufficiently Active (07/25/2023)   Exercise Vital Sign    Days of Exercise  per Week: 4 days    Minutes of Exercise per Session: 30 min  Stress: Stress Concern Present (07/25/2023)   [employer] of Occupational Health - Occupational Stress Questionnaire    Feeling of Stress : To some extent  Social Connections: Moderately Integrated (07/25/2023)   Social Connection and Isolation Panel [NHANES]    Frequency of Communication with Friends and Family: More than three times a week    Frequency of Social Gatherings with Friends and Family: Twice a week    Attends Religious Services: 1 to 4 times per year    Active Member of [employer] or Organizations: No    Attends Banker Meetings: Never    Marital Status: Married   Recent Concern: Social Connections - Moderately Isolated (04/29/2023)   Social Connection and Isolation Panel [NHANES]    Frequency of Communication with Friends and Family: More than three times a week    Frequency of Social Gatherings with Friends and Family: Once a week    Attends Religious Services: Never    Database administrator or Organizations: No    Attends Banker Meetings: Never    Marital Status: Married  Catering manager Violence: Not At Risk (07/25/2023)   Humiliation, Afraid, Rape, and Kick questionnaire    Fear of Current or Ex-Partner: No    Emotionally Abused: No    Physically Abused: No    Sexually Abused: No    Family History  Problem Relation Age of Onset   Hypertension Mother    Heart disease Father     Past Surgical History:  Procedure Laterality Date   MOUTH SURGERY      ROS: Review of Systems Negative except as stated above  PHYSICAL EXAM: BP (!) 137/99   Pulse 73   Temp 98.1 F (36.7 C) (Oral)   Resp 16   Ht 5' 7.5" (1.715 m)   Wt 214 lb 12.8 oz (97.4 kg)   SpO2 96%   BMI 33.15 kg/m   Physical Exam HENT:     Head: Normocephalic and atraumatic.     Nose: Nose normal.     Mouth/Throat:     Mouth: Mucous membranes are moist.     Pharynx: Oropharynx is clear.  Eyes:     Extraocular Movements: Extraocular movements intact.     Conjunctiva/sclera: Conjunctivae normal.     Pupils: Pupils are equal, round, and reactive to light.  Cardiovascular:     Rate and Rhythm: Normal rate and regular rhythm.     Pulses: Normal pulses.     Heart sounds: Normal heart sounds.  Pulmonary:     Effort: Pulmonary effort is normal.     Breath sounds: Normal breath sounds.  Musculoskeletal:        General: Normal range of motion.     Cervical back: Normal range of motion and neck supple.  Neurological:     General: No focal deficit present.     Mental Status: He is alert and oriented to person, place, and time.  Psychiatric:        Mood  and Affect: Mood normal.        Behavior: Behavior normal.    ASSESSMENT AND PLAN: 1. Primary hypertension (Primary) - New onset.  - Blood pressure not at goal during today's visit. Patient asymptomatic without chest pressure, chest pain, palpitations, shortness of breath, worst headache of life, and any additional red flag symptoms. - Trial Amlodipine as prescribed.  - Counseled on blood pressure goal  of less than 130/80, low-sodium, DASH diet, medication compliance, and 150 minutes of moderate intensity exercise per week as tolerated. Counseled on medication adherence and adverse effects. - Follow-up with primary provider in 4 weeks or sooner if needed.  - amLODipine (NORVASC) 5 MG tablet; Take 1 tablet (5 mg total) by mouth daily.  Dispense: 90 tablet; Refill: 0  2. Hyperlipidemia, unspecified hyperlipidemia type - Patient reports he is not taking Atorvastatin .  - Routine screening.  - Follow-up with primary provider as scheduled. - Lipid panel  3. Routine eye exam - Referral to Ophthalmology for evaluation/management. - Ambulatory referral to Ophthalmology   Patient was given the opportunity to ask questions.  Patient verbalized understanding of the plan and was able to repeat key elements of the plan. Patient was given clear instructions to go to Emergency Department or return to medical center if symptoms don't improve, worsen, or new problems develop.The patient verbalized understanding.   Orders Placed This Encounter  Procedures   Lipid panel   Ambulatory referral to Ophthalmology     Requested Prescriptions   Signed Prescriptions Disp Refills   amLODipine (NORVASC) 5 MG tablet 90 tablet 0    Sig: Take 1 tablet (5 mg total) by mouth daily.    Return in about 4 weeks (around 08/22/2023) for Follow-Up or next available chronic conditions.  Senaida Dama, NP

## 2023-07-25 NOTE — Progress Notes (Signed)
 2 months follow up, dieting and exercise to get levels down

## 2023-07-26 ENCOUNTER — Other Ambulatory Visit: Payer: Self-pay | Admitting: Family

## 2023-07-26 ENCOUNTER — Encounter: Payer: Self-pay | Admitting: Family

## 2023-07-26 DIAGNOSIS — E785 Hyperlipidemia, unspecified: Secondary | ICD-10-CM

## 2023-07-26 LAB — LIPID PANEL
Chol/HDL Ratio: 6.7 ratio — ABNORMAL HIGH (ref 0.0–5.0)
Cholesterol, Total: 202 mg/dL — ABNORMAL HIGH (ref 100–199)
HDL: 30 mg/dL — ABNORMAL LOW (ref >39)
LDL Chol Calc (NIH): 122 mg/dL — ABNORMAL HIGH (ref 0–99)
Triglycerides: 284 mg/dL — ABNORMAL HIGH (ref 0–149)
VLDL Cholesterol Cal: 50 mg/dL — ABNORMAL HIGH (ref 5–40)

## 2023-07-26 MED ORDER — ATORVASTATIN CALCIUM 20 MG PO TABS: 20.0000 mg | ORAL_TABLET | Freq: Every day | ORAL | 0 refills | Status: AC

## 2023-07-26 NOTE — Progress Notes (Signed)
 I spoke with patient and made them aware of their lab results and recommendations per PCP.  Patient verbalized understanding

## 2023-08-26 ENCOUNTER — Encounter: Admitting: Family

## 2023-08-26 NOTE — Progress Notes (Signed)
 Erroneous encounter-disregard

## 2023-09-01 ENCOUNTER — Encounter: Payer: Self-pay | Admitting: Family

## 2023-09-26 ENCOUNTER — Encounter: Admitting: Family

## 2023-09-26 NOTE — Progress Notes (Signed)
 Erroneous encounter-disregard

## 2023-10-28 ENCOUNTER — Encounter: Admitting: Family

## 2023-10-28 ENCOUNTER — Telehealth: Payer: Self-pay | Admitting: Family

## 2023-10-28 NOTE — Telephone Encounter (Signed)
 Called pt to reschedule missed appt; could not reach or leave vm due to it not being set up

## 2023-10-28 NOTE — Progress Notes (Signed)
 Erroneous encounter-disregard

## 2023-12-30 ENCOUNTER — Encounter (INDEPENDENT_AMBULATORY_CARE_PROVIDER_SITE_OTHER): Payer: Self-pay

## 2024-01-28 ENCOUNTER — Ambulatory Visit
Admission: RE | Admit: 2024-01-28 | Discharge: 2024-01-28 | Disposition: A | Discharge status: Home / Self Care | Source: Ambulatory Visit

## 2024-01-28 VITALS — BP 147/98 | HR 93 | Temp 98.2°F | Resp 20 | Ht 67.0 in | Wt 212.0 lb

## 2024-01-28 DIAGNOSIS — T1591XA Foreign body on external eye, part unspecified, right eye, initial encounter: Secondary | ICD-10-CM

## 2024-01-28 MED ORDER — MOXIFLOXACIN HCL 0.5 % OP SOLN: 1.0000 [drp] | Freq: Four times a day (QID) | OPHTHALMIC | 1 refills | Status: AC

## 2024-01-28 NOTE — ED Triage Notes (Signed)
 Alan Murphy presents with right eye blurriness that started 2 weeks ago, painful and redness. Left eye swelling that started today. No injury noted, last wore contacts 6/7 months ago. No treatment used.

## 2024-01-28 NOTE — Discharge Instructions (Addendum)
 Ophthalmologist on-call has been contacted, awaiting their return call.  As soon as they call me I will call you at the number you gave me-[phone].    Please report to the address below by 1 PM [facility]     [email or web address] Ophthalmologist in [facility], [place] [address], [place], [place] [address]  11 mi Closed  Opens Mon 8 AM  More hours [phone]

## 2024-01-28 NOTE — ED Provider Notes (Addendum)
 [facility]    CSN: [id number] Arrival date & time: 01/28/24  1000      History   Chief Complaint Chief Complaint  Patient presents with   Eye Problem    Extreme pain and light sensitivity - Entered by patient    HPI Alan Murphy is a 38 y.o. male.   Patient presents to due to 2 weeks worth of actually increasing blurred vision in his right eye and started to notice pain and sensitivity to light today.  Patient states that he wore contacts about 6 to 7 months ago.  Patient states that he lost the contact and never found it.  Patient's current vision is left: 20/50 right 20/70.  The history is provided by the patient.  Eye Problem   Past Medical History:  Diagnosis Date   Asthma     Patient Active Problem List   Diagnosis Date Noted   Encounter for pre-employment health screening examination 09/27/2014   Asthma 05/12/1991    Past Surgical History:  Procedure Laterality Date   MOUTH SURGERY         Home Medications    Prior to Admission medications   Medication Sig Start Date End Date Taking? Authorizing Provider  albuterol  (PROVENTIL ) (2.5 MG/3ML) 0.083% nebulizer solution Take 3 mLs (2.5 mg total) by nebulization every 6 (six) hours as needed for wheezing or shortness of breath. 03/16/23   Gladis Elsie BROCKS, PA-C  albuterol  (VENTOLIN  HFA) 108 (90 Base) MCG/ACT inhaler Inhale 1-2 puffs into the lungs every 6 (six) hours as needed for shortness of breath or wheezing. 03/16/23   Gladis Elsie BROCKS, PA-C  amLODipine  (NORVASC ) 5 MG tablet Take 1 tablet (5 mg total) by mouth daily. 07/25/23   Lorren Greig PARAS, NP  atorvastatin  (LIPITOR) 20 MG tablet Take 1 tablet (20 mg total) by mouth daily. 07/26/23   Lorren Greig PARAS, NP  benzonatate  (TESSALON ) 100 MG capsule Take 1 capsule (100 mg total) by mouth 3 (three) times daily as needed for cough. Patient not taking: Reported on 05/27/2023 03/16/23   Gladis Elsie BROCKS, PA-C  predniSONE  (DELTASONE ) 20 MG tablet  Take 2 tablets (40 mg total) by mouth daily with breakfast. Patient not taking: Reported on 05/27/2023 03/16/23   Gladis Elsie BROCKS, PA-C    Family History Family History  Problem Relation Age of Onset   Hypertension Mother    Heart disease Father     Social History Social History   Tobacco Use   Smoking status: Every Day    Types: Cigarettes    Passive exposure: Current   Smokeless tobacco: Never  Vaping Use   Vaping status: Never Used  Substance Use Topics   Alcohol use: Yes    Alcohol/week: 3.0 standard drinks of alcohol    Types: 3 Standard drinks or equivalent per week    Comment: occ   Drug use: Yes    Types: Marijuana    Comment: occ     Allergies   Cat dander   Review of Systems Review of Systems   Physical Exam Triage Vital Signs ED Triage Vitals  Encounter Vitals Group     BP 01/28/24 1018 (!) 147/98     Girls Systolic BP Percentile --      Girls Diastolic BP Percentile --      Boys Systolic BP Percentile --      Boys Diastolic BP Percentile --      Pulse Rate 01/28/24 1018 93     Resp  01/28/24 1018 20     Temp 01/28/24 1018 98.2 F (36.8 C)     Temp Source 01/28/24 1018 Oral     SpO2 01/28/24 1018 98 %     Weight 01/28/24 1015 212 lb (96.2 kg)     Height 01/28/24 1015 5' 7 (1.702 m)     Head Circumference --      Peak Flow --      Pain Score 01/28/24 1015 8     Pain Loc --      Pain Education --      Exclude from Growth Chart --    No data found.  Updated Vital Signs BP (!) 147/98 (BP Location: Left Arm)   Pulse 93   Temp 98.2 F (36.8 C) (Oral)   Resp 20   Ht 5' 7 (1.702 m)   Wt 212 lb (96.2 kg)   SpO2 98%   BMI 33.20 kg/m   Visual Acuity Right Eye Distance: 20/25 (corrected) Left Eye Distance: 20/70 (corrected) Bilateral Distance: 20/25 (corrected)  Right Eye Near:   Left Eye Near:    Bilateral Near:     Physical Exam Vitals and nursing note reviewed.  Constitutional:      General: He is not in acute distress.     Appearance: Normal appearance. He is not ill-appearing, toxic-appearing or diaphoretic.  Eyes:     General: No scleral icterus.    Extraocular Movements: Extraocular movements intact.     Pupils: Pupils are equal, round, and reactive to light.      Comments: Injected bulbar and palpebral conjunctivitis of right eye, corneal ulcer (cloudy, smudge appearance) visible right eye on vision exam, fluorescein stain uptake noted on Woods lamp exam, bluish halo around right iris, concerning for retained contact in eye  Cardiovascular:     Rate and Rhythm: Normal rate and regular rhythm.     Heart sounds: Normal heart sounds.  Pulmonary:     Effort: Pulmonary effort is normal. No respiratory distress.     Breath sounds: Normal breath sounds. No wheezing or rhonchi.  Skin:    General: Skin is warm.  Neurological:     Mental Status: He is alert and oriented to person, place, and time.  Psychiatric:        Mood and Affect: Mood normal.        Behavior: Behavior normal.      UC Treatments / Results  Labs (all labs ordered are listed, but only abnormal results are displayed) Labs Reviewed - No data to display  EKG   Radiology No results found.  Procedures Procedures (including critical care time)  Medications Ordered in UC Medications - No data to display  Initial Impression / Assessment and Plan / UC Course  I have reviewed the triage vital signs and the nursing notes.  Pertinent labs & imaging results that were available during my care of the patient were reviewed by me and considered in my medical decision making (see chart for details).     Administered tetracaine for pain relief, used eyewash and gentle massage of close right eye, attempted to remove contact with moist cotton-tipped applicator without success.  Fluorescein stain performed showing fluorescein uptake.  Called on-call ophthalmologist via [facility], stated they will be able to see him at [facility] in [facility] at 1 PM. Final Clinical Impressions(s) / UC Diagnoses   Final diagnoses:  Foreign body of right eye, initial encounter     Discharge Instructions  Ophthalmologist on-call has been contacted, awaiting their return call.  As soon as they call me I will call you at the number you gave me-[phone].  Please go home and await our call.     ED Prescriptions   None    PDMP not reviewed this encounter.   Andra Corean BROCKS, PA-C 01/28/24 1122    Andra Corean C, PA-C 01/28/24 1144

## 2024-01-29 ENCOUNTER — Telehealth: Payer: Self-pay

## 2024-01-29 NOTE — Telephone Encounter (Signed)
 Spoke with patient about his visit with ophthalmology. Pt states that he has a corneal ulcer and has started to use antibiotic eyedrops prescribed. Ophthalmologist did not see a contact lens in his eye but patient states that he is experiencing some relief and will follow-up with eye doctor again today.

## 2024-02-02 ENCOUNTER — Other Ambulatory Visit: Payer: Self-pay | Admitting: Family

## 2024-02-02 NOTE — Telephone Encounter (Signed)
 Copied from CRM #[id number]. Topic: Clinical - Medication Refill >> Feb 02, 2024  3:45 PM Pinkey ORN wrote: Medication: moxifloxacin (VIGAMOX) 0.5 % ophthalmic solution  Has the patient contacted their pharmacy? Yes (Agent: If no, request that the patient contact the pharmacy for the refill. If patient does not wish to contact the pharmacy document the reason why and proceed with request.) (Agent: If yes, when and what did the pharmacy advise?)  This is the patient's preferred pharmacy:  [facility] [address] ([address]), [place] [address] [place] ([place]) [place] [address] Phone: [phone] Fax: [phone]   Is this the correct pharmacy for this prescription? Yes If no, delete pharmacy and type the correct one.   Has the prescription been filled recently? No  Is the patient out of the medication? Yes  Has the patient been seen for an appointment in the last year OR does the patient have an upcoming appointment? Yes  Can we respond through MyChart? Yes  Agent: Please be advised that Rx refills may take up to 3 business days. We ask that you follow-up with your pharmacy.

## 2024-02-03 NOTE — Telephone Encounter (Signed)
 Requested medication (s) are due for refill today - no  Requested medication (s) are on the active medication list -yes  Future visit scheduled -yes  Last refill: 01/28/24 3ml 1RF  Notes to clinic: outside provider   Requested Prescriptions  Pending Prescriptions Disp Refills   moxifloxacin (VIGAMOX) 0.5 % ophthalmic solution 3 mL 1    Sig: Place 1 drop into the right eye in the morning, at noon, in the evening, and at bedtime.     Off-Protocol Failed - 02/03/2024  4:25 PM      Failed - Medication not assigned to a protocol, review manually.      Passed - Valid encounter within last 12 months    Recent Outpatient Visits           6 months ago Primary hypertension   [facility], [place] J, NP   8 months ago Hyperlipidemia, unspecified hyperlipidemia type   [facility], [place], NP   9 months ago Annual physical exam   [facility], Amy J, NP   1 year ago Encounter to establish care   [facility], Amy J, NP                 Requested Prescriptions  Pending Prescriptions Disp Refills   moxifloxacin (VIGAMOX) 0.5 % ophthalmic solution 3 mL 1    Sig: Place 1 drop into the right eye in the morning, at noon, in the evening, and at bedtime.     Off-Protocol Failed - 02/03/2024  4:25 PM      Failed - Medication not assigned to a protocol, review manually.      Passed - Valid encounter within last 12 months    Recent Outpatient Visits           6 months ago Primary hypertension   [place] [facility], [place] J, NP   8 months ago Hyperlipidemia, unspecified hyperlipidemia type   [facility], [place], NP   9 months ago Annual physical exam   [facility], Amy J, NP   1 year ago Encounter to establish care   [facility], Greig PARAS, NP

## 2024-02-27 ENCOUNTER — Encounter: Payer: Self-pay | Admitting: Emergency Medicine

## 2024-02-27 ENCOUNTER — Ambulatory Visit
Admission: RE | Admit: 2024-02-27 | Discharge: 2024-02-27 | Disposition: A | Discharge status: Home / Self Care | Source: Ambulatory Visit | Attending: Emergency Medicine | Admitting: Emergency Medicine

## 2024-02-27 ENCOUNTER — Ambulatory Visit: Admission: RE | Admit: 2024-02-27 | Discharge: 2024-02-27 | Disposition: A | Source: Ambulatory Visit

## 2024-02-27 DIAGNOSIS — J069 Acute upper respiratory infection, unspecified: Secondary | ICD-10-CM | POA: Diagnosis not present

## 2024-02-27 DIAGNOSIS — J45901 Unspecified asthma with (acute) exacerbation: Secondary | ICD-10-CM

## 2024-02-27 LAB — POC COVID19/FLU A&B COMBO
Covid Antigen, POC: NEGATIVE
Influenza A Antigen, POC: NEGATIVE
Influenza B Antigen, POC: NEGATIVE

## 2024-02-27 MED ORDER — ALBUTEROL SULFATE HFA 108 (90 BASE) MCG/ACT IN AERS: 1.0000 | INHALATION_SPRAY | Freq: Four times a day (QID) | RESPIRATORY_TRACT | 0 refills | Status: AC | PRN

## 2024-02-27 MED ORDER — PREDNISONE 20 MG PO TABS: 40.0000 mg | ORAL_TABLET | Freq: Every day | ORAL | 0 refills | Status: AC

## 2024-02-27 NOTE — ED Provider Notes (Signed)
 [facility]    CSN: [id number] Arrival date & time: 02/27/24  1747      History   Chief Complaint Chief Complaint  Patient presents with   Fever   Cough   Nasal Congestion   Generalized Body Aches    HPI Alan Murphy is a 38 y.o. male.   Patient presents to clinic over concern of fever, fatigue, generalized bodyaches, wheezing and shortness of breath.  Initially symptoms started yesterday upon wakening with a dry cough.  Last night progressed to generalized bodyaches, fatigue, congestion and a cough.  This morning around 7 AM patient was sent home early from work with a fever of 103, work clinic did give him Tylenol  prior to departure.  Patient has been trying to use his albuterol  inhaler but it is not working.  Not really distributing any medication.  Thinks it is broken.  Has been using albuterol  breathing treatments without much relief of his wheezing and shortness of breath.    The history is provided by the patient and medical records.  Fever Cough   Past Medical History:  Diagnosis Date   Asthma     Patient Active Problem List   Diagnosis Date Noted   Encounter for pre-employment health screening examination 09/27/2014   Asthma 05/12/1991    Past Surgical History:  Procedure Laterality Date   MOUTH SURGERY         Home Medications    Prior to Admission medications   Medication Sig Start Date End Date Taking? Authorizing Provider  albuterol  (PROVENTIL ) (2.5 MG/3ML) 0.083% nebulizer solution Take 3 mLs (2.5 mg total) by nebulization every 6 (six) hours as needed for wheezing or shortness of breath. 03/16/23  Yes Gladis Elsie BROCKS, PA-C  albuterol  (VENTOLIN  HFA) 108 (90 Base) MCG/ACT inhaler Inhale 1-2 puffs into the lungs every 6 (six) hours as needed for shortness of breath or wheezing. 03/16/23  Yes Gladis Elsie BROCKS, PA-C  albuterol  (VENTOLIN  HFA) 108 (90 Base) MCG/ACT inhaler Inhale 1-2 puffs into the lungs every 6 (six) hours as  needed for wheezing or shortness of breath. 02/27/24  Yes Rim Thatch  N, FNP  moxifloxacin  (VIGAMOX ) 0.5 % ophthalmic solution Place 1 drop into the right eye in the morning, at noon, in the evening, and at bedtime. 01/28/24  Yes Andra Krabbe C, PA-C  predniSONE  (DELTASONE ) 20 MG tablet Take 2 tablets (40 mg total) by mouth daily with breakfast for 5 days. 02/27/24 03/03/24 Yes Mirely Pangle  N, FNP  amLODipine  (NORVASC ) 5 MG tablet Take 1 tablet (5 mg total) by mouth daily. Patient not taking: Reported on 02/27/2024 07/25/23   Jaycee Greig PARAS, NP  atorvastatin  (LIPITOR) 20 MG tablet Take 1 tablet (20 mg total) by mouth daily. Patient not taking: Reported on 02/27/2024 07/26/23   Jaycee Greig PARAS, NP    Family History Family History  Problem Relation Age of Onset   Hypertension Mother    Heart disease Father     Social History Social History   Tobacco Use   Smoking status: Every Day    Types: Cigarettes    Passive exposure: Current   Smokeless tobacco: Never  Vaping Use   Vaping status: Never Used  Substance Use Topics   Alcohol use: Yes    Alcohol/week: 3.0 standard drinks of alcohol    Types: 3 Standard drinks or equivalent per week    Comment: occ   Drug use: Yes    Types: Marijuana    Comment: occ  Allergies   Cat dander   Review of Systems Review of Systems  Per HPI  Physical Exam Triage Vital Signs ED Triage Vitals  Encounter Vitals Group     BP 02/27/24 1815 131/84     Girls Systolic BP Percentile --      Girls Diastolic BP Percentile --      Boys Systolic BP Percentile --      Boys Diastolic BP Percentile --      Pulse Rate 02/27/24 1815 98     Resp 02/27/24 1815 18     Temp 02/27/24 1815 97.9 F (36.6 C)     Temp Source 02/27/24 1815 Oral     SpO2 02/27/24 1815 95 %     Weight --      Height --      Head Circumference --      Peak Flow --      Pain Score 02/27/24 1812 7     Pain Loc --      Pain Education --      Exclude from Growth  Chart --    No data found.  Updated Vital Signs BP 131/84 (BP Location: Left Arm)   Pulse 98   Temp 97.9 F (36.6 C) (Oral)   Resp 18   SpO2 95%   Visual Acuity Right Eye Distance:   Left Eye Distance:   Bilateral Distance:    Right Eye Near:   Left Eye Near:    Bilateral Near:     Physical Exam Vitals and nursing note reviewed.  Constitutional:      Appearance: Normal appearance.  HENT:     Head: Normocephalic and atraumatic.     Right Ear: External ear normal.     Left Ear: External ear normal.     Nose: Congestion present.     Mouth/Throat:     Mouth: Mucous membranes are moist.     Pharynx: Posterior oropharyngeal erythema present.  Eyes:     Conjunctiva/sclera: Conjunctivae normal.  Cardiovascular:     Rate and Rhythm: Normal rate and regular rhythm.     Heart sounds: Normal heart sounds. No murmur heard. Pulmonary:     Effort: Pulmonary effort is normal. No respiratory distress.     Breath sounds: Wheezing present.  Musculoskeletal:        General: Normal range of motion.  Skin:    General: Skin is warm and dry.  Neurological:     General: No focal deficit present.     Mental Status: He is alert.  Psychiatric:        Mood and Affect: Mood normal.      UC Treatments / Results  Labs (all labs ordered are listed, but only abnormal results are displayed) Labs Reviewed  POC COVID19/FLU A&B COMBO - Normal    EKG   Radiology No results found.  Procedures Procedures (including critical care time)  Medications Ordered in UC Medications - No data to display  Initial Impression / Assessment and Plan / UC Course  I have reviewed the triage vital signs and the nursing notes.  Pertinent labs & imaging results that were available during my care of the patient were reviewed by me and considered in my medical decision making (see chart for details).  Vitals and triage reviewed, patient is hemodynamically stable.  Lungs with expiratory wheezing in all  lobes, heart with regular rate and rhythm.  Congestion and rhinorrhea present, suspect viral illness.  POC COVID and flu testing negative.  Viral URI appears to have triggered asthma.  Will refill albuterol  inhaler and treat with prednisone  burst for wheezing and shortness of breath.  Plan of care, follow-up care return precautions given, no questions at this time.  Work note provided.    Final Clinical Impressions(s) / UC Diagnoses   Final diagnoses:  Viral URI with cough  Mild asthma with acute exacerbation, unspecified whether persistent     Discharge Instructions      COVID and flu testing were negative, you most likely have a different viral illness.  These typically last 5 to 7 days in duration.  Start the steroids when you are ready to be up for the day, take these with breakfast.  These will help with inflammation in your lungs.  Continue to use your albuterol  or nebulizer every 4-6 hours as needed for wheezing or shortness of breath.  You can alternate between 800 mg of ibuprofen  and 500 mg of Tylenol  every 4-6 hours to help with fever, body aches and chills.  Warm saline gargles, tea with honey and over-the-counter cough drops will help with sore throat.  Drinking at least 64 ounces of water in addition to taking 1200 mg of Mucinex  can help loosen up secretions.  Symptoms should improve over the next few days with treatment.  If no improvement or any changes return to clinic for reevaluation.     ED Prescriptions     Medication Sig Dispense Auth. Provider   albuterol  (VENTOLIN  HFA) 108 (90 Base) MCG/ACT inhaler Inhale 1-2 puffs into the lungs every 6 (six) hours as needed for wheezing or shortness of breath. 18 g Dreama, Brynna Dobos  N, FNP   predniSONE  (DELTASONE ) 20 MG tablet Take 2 tablets (40 mg total) by mouth daily with breakfast for 5 days. 10 tablet Dreama, Daeshon Grammatico  N, FNP      PDMP not reviewed this encounter.   Dreama, Kirston Luty  N, FNP 02/27/24 1904

## 2024-02-27 NOTE — ED Triage Notes (Addendum)
 Pt reports productive cough, fevers, chills, nasal congestion, and body aches that started last night. Max temp: 103 this morning. Taking tylenol  and nyquil with some relief. Denies sore throat. Denies sick contacts. Requests refill of albuterol  inhaler.

## 2024-02-27 NOTE — Discharge Instructions (Signed)
 COVID and flu testing were negative, you most likely have a different viral illness.  These typically last 5 to 7 days in duration.  Start the steroids when you are ready to be up for the day, take these with breakfast.  These will help with inflammation in your lungs.  Continue to use your albuterol  or nebulizer every 4-6 hours as needed for wheezing or shortness of breath.  You can alternate between 800 mg of ibuprofen  and 500 mg of Tylenol  every 4-6 hours to help with fever, body aches and chills.  Warm saline gargles, tea with honey and over-the-counter cough drops will help with sore throat.  Drinking at least 64 ounces of water in addition to taking 1200 mg of Mucinex  can help loosen up secretions.  Symptoms should improve over the next few days with treatment.  If no improvement or any changes return to clinic for reevaluation.

## 2024-04-30 ENCOUNTER — Encounter: Payer: Medicaid Other | Admitting: Family

## 2024-05-03 ENCOUNTER — Telehealth
